# Patient Record
Sex: Female | Born: 1970 | Hispanic: No | Marital: Single | Smoking: Never smoker
Health system: Southern US, Community
[De-identification: ages and names within clinical notes are randomized; demographics above are authoritative.]

## PROBLEM LIST (undated history)

## (undated) DIAGNOSIS — J302 Other seasonal allergic rhinitis: Secondary | ICD-10-CM

## (undated) DIAGNOSIS — J45909 Unspecified asthma, uncomplicated: Secondary | ICD-10-CM

## (undated) DIAGNOSIS — G629 Polyneuropathy, unspecified: Secondary | ICD-10-CM

## (undated) DIAGNOSIS — K219 Gastro-esophageal reflux disease without esophagitis: Secondary | ICD-10-CM

## (undated) DIAGNOSIS — G43909 Migraine, unspecified, not intractable, without status migrainosus: Secondary | ICD-10-CM

## (undated) HISTORY — PX: ABDOMINAL HYSTERECTOMY: SHX81

## (undated) HISTORY — PX: PORTA CATH INSERTION: CATH118285

---

## 2016-11-03 ENCOUNTER — Emergency Department (HOSPITAL_COMMUNITY)
Admission: EM | Admit: 2016-11-03 | Discharge: 2016-11-03 | Disposition: A | Discharge status: Home / Self Care | Payer: BLUE CROSS/BLUE SHIELD | Attending: Emergency Medicine | Admitting: Emergency Medicine

## 2016-11-03 ENCOUNTER — Encounter (HOSPITAL_COMMUNITY): Payer: Self-pay | Admitting: Emergency Medicine

## 2016-11-03 DIAGNOSIS — E860 Dehydration: Secondary | ICD-10-CM | POA: Diagnosis not present

## 2016-11-03 DIAGNOSIS — R197 Diarrhea, unspecified: Secondary | ICD-10-CM | POA: Diagnosis present

## 2016-11-03 DIAGNOSIS — Z79899 Other long term (current) drug therapy: Secondary | ICD-10-CM | POA: Diagnosis not present

## 2016-11-03 LAB — LIPASE, BLOOD: Lipase: 30 U/L (ref 11–51)

## 2016-11-03 LAB — COMPREHENSIVE METABOLIC PANEL
ALBUMIN: 3.6 g/dL (ref 3.5–5.0)
ALT: 27 U/L (ref 14–54)
AST: 25 U/L (ref 15–41)
Alkaline Phosphatase: 49 U/L (ref 38–126)
Anion gap: 7 (ref 5–15)
BILIRUBIN TOTAL: 0.7 mg/dL (ref 0.3–1.2)
BUN: 17 mg/dL (ref 6–20)
CALCIUM: 8.8 mg/dL — AB (ref 8.9–10.3)
CO2: 25 mmol/L (ref 22–32)
Chloride: 104 mmol/L (ref 101–111)
Creatinine, Ser: 0.73 mg/dL (ref 0.44–1.00)
Glucose, Bld: 94 mg/dL (ref 65–99)
POTASSIUM: 3.9 mmol/L (ref 3.5–5.1)
Sodium: 136 mmol/L (ref 135–145)
TOTAL PROTEIN: 7.4 g/dL (ref 6.5–8.1)

## 2016-11-03 LAB — PREGNANCY, URINE: PREG TEST UR: NEGATIVE

## 2016-11-03 LAB — URINALYSIS, ROUTINE W REFLEX MICROSCOPIC
BACTERIA UA: NONE SEEN
BILIRUBIN URINE: NEGATIVE
GLUCOSE, UA: NEGATIVE mg/dL
Ketones, ur: NEGATIVE mg/dL
NITRITE: NEGATIVE
PROTEIN: NEGATIVE mg/dL
Specific Gravity, Urine: 1.024 (ref 1.005–1.030)
pH: 5 (ref 5.0–8.0)

## 2016-11-03 LAB — CBC
HEMATOCRIT: 37 % (ref 36.0–46.0)
HEMOGLOBIN: 12.6 g/dL (ref 12.0–15.0)
MCH: 32.6 pg (ref 26.0–34.0)
MCHC: 34.1 g/dL (ref 30.0–36.0)
MCV: 95.9 fL (ref 78.0–100.0)
Platelets: 192 10*3/uL (ref 150–400)
RBC: 3.86 MIL/uL — AB (ref 3.87–5.11)
RDW: 13.4 % (ref 11.5–15.5)
WBC: 5.3 10*3/uL (ref 4.0–10.5)

## 2016-11-03 LAB — POC URINE PREG, ED: Preg Test, Ur: NEGATIVE

## 2016-11-03 MED ORDER — SODIUM CHLORIDE 0.9 % IV BOLUS (SEPSIS)
1000.0000 mL | Freq: Once | INTRAVENOUS | Status: AC
  Administered 2016-11-03: 1000 mL via INTRAVENOUS

## 2016-11-03 MED ORDER — ONDANSETRON 4 MG PO TBDP: 4.0000 mg | ORAL_TABLET | Freq: Three times a day (TID) | ORAL | 0 refills | Status: DC | PRN

## 2016-11-03 NOTE — ED Notes (Signed)
Called lab to inquire about delay in labs.  States they have not been drawn.  Located them in the phlebotomist basket on cart.  Drawn at 1030.

## 2016-11-03 NOTE — Discharge Instructions (Signed)
Otc imodium as needed for diarrhea

## 2016-11-03 NOTE — ED Provider Notes (Addendum)
[facility] DEPT Provider Note   CSN: [id number] Arrival date & time: 11/03/16  9528     History   Chief Complaint Chief Complaint  Patient presents with  . Diarrhea    HPI Anna Stevenson is a 46 y.o. female.  Pt presents to the ED today with diarrhea and abdominal pain.  She denies fever, nausea and vomiting.      History reviewed. No pertinent past medical history.  There are no active problems to display for this patient.   Past Surgical History:  Procedure Laterality Date  . ABDOMINAL HYSTERECTOMY    . CESAREAN SECTION      OB History    No data available       Home Medications    Prior to Admission medications   Medication Sig Start Date End Date Taking? Authorizing Provider  Ascorbic Acid (VITAMIN C PO) Take 1 tablet by mouth daily.   Yes [provider]  Cholecalciferol (VITAMIN D PO) Take 1 tablet by mouth daily.   Yes [provider]  diclofenac (VOLTAREN) 50 MG EC tablet Take 50 mg by mouth 2 (two) times daily.   Yes [provider]  etodolac (LODINE) 300 MG capsule Take 300 mg by mouth every 8 (eight) hours as needed for moderate pain.   Yes [provider]  famotidine (PEPCID) 20 MG tablet Take 20 mg by mouth at bedtime.   Yes [provider]  gabapentin (NEURONTIN) 300 MG capsule Take 300 mg by mouth 3 (three) times daily.   Yes [provider]  rizatriptan (MAXALT) 10 MG tablet Take 10 mg by mouth as needed for migraine. May repeat in 2 hours if needed   Yes [provider]  tiZANidine (ZANAFLEX) 4 MG tablet Take 4 mg by mouth every 6 (six) hours as needed for muscle spasms.   Yes [provider]  topiramate (TOPAMAX) 50 MG tablet Take 50 mg by mouth 2 (two) times daily.   Yes [provider]  traMADol (ULTRAM) 50 MG tablet Take 50 mg by mouth 3 (three) times daily.   Yes [provider]  ondansetron (ZOFRAN ODT) 4 MG disintegrating tablet Take 1 tablet (4  mg total) by mouth every 8 (eight) hours as needed. 11/03/16   Isla Pence, MD    Family History No family history on file.  Social History Social History  Substance Use Topics  . Smoking status: Never Smoker  . Smokeless tobacco: Never Used  . Alcohol use Yes     Comment: occ     Allergies   Bactrim [sulfamethoxazole-trimethoprim]   Review of Systems Review of Systems  Gastrointestinal: Positive for abdominal pain and diarrhea.  All other systems reviewed and are negative.    Physical Exam Updated Vital Signs BP 118/74 (BP Location: Left Arm)   Pulse 84   Temp 98.7 F (37.1 C) (Oral)   Resp 16   Ht 5\' 9"  (1.753 m)   Wt 87.1 kg (192 lb)   SpO2 100%   BMI 28.35 kg/m   Physical Exam  Constitutional: She is oriented to person, place, and time. She appears well-developed and well-nourished.  HENT:  Head: Normocephalic and atraumatic.  Right Ear: External ear normal.  Left Ear: External ear normal.  Nose: Nose normal.  Mouth/Throat: Oropharynx is clear and moist.  Eyes: Pupils are equal, round, and reactive to light. Conjunctivae and EOM are normal.  Neck: Normal range of motion. Neck supple.  Cardiovascular: Normal rate, regular rhythm, normal heart  sounds and intact distal pulses.   Pulmonary/Chest: Effort normal and breath sounds normal.  Abdominal: Soft. Bowel sounds are normal.  Musculoskeletal: Normal range of motion.  Neurological: She is alert and oriented to person, place, and time.  Skin: Skin is warm.  Psychiatric: She has a normal mood and affect. Her behavior is normal. Judgment and thought content normal.  Nursing note and vitals reviewed.    ED Treatments / Results  Labs (all labs ordered are listed, but only abnormal results are displayed) Labs Reviewed  COMPREHENSIVE METABOLIC PANEL - Abnormal; Notable for the following:       Result Value   Calcium 8.8 (*)    All other components within normal limits  CBC - Abnormal; Notable for the  following:    RBC 3.86 (*)    All other components within normal limits  URINALYSIS, ROUTINE W REFLEX MICROSCOPIC - Abnormal; Notable for the following:    APPearance HAZY (*)    Hgb urine dipstick SMALL (*)    Leukocytes, UA TRACE (*)    Squamous Epithelial / LPF 6-30 (*)    All other components within normal limits  LIPASE, BLOOD  PREGNANCY, URINE    EKG  EKG Interpretation None       Radiology No results found.  Procedures Procedures (including critical care time)  Medications Ordered in ED Medications  sodium chloride 0.9 % bolus 1,000 mL (0 mLs Intravenous Stopped 11/03/16 1159)     Initial Impression / Assessment and Plan / ED Course  I have reviewed the triage vital signs and the nursing notes.  Pertinent labs & imaging results that were available during my care of the patient were reviewed by me and considered in my medical decision making (see chart for details).    Pt is feeling better.  She has not had any diarrhea here.  She has normal labs.  She knows to return if worse.  F/u with pcp.  Final Clinical Impressions(s) / ED Diagnoses   Final diagnoses:  Diarrhea, unspecified type  Dehydration    New Prescriptions New Prescriptions   ONDANSETRON (ZOFRAN ODT) 4 MG DISINTEGRATING TABLET    Take 1 tablet (4 mg total) by mouth every 8 (eight) hours as needed.     Isla Pence, MD 11/03/16 1252    Isla Pence, MD 11/12/16 1340

## 2016-11-03 NOTE — ED Triage Notes (Signed)
Patient complains of lower abdominal pain and diarrhea that started yesterday. Patient denies nausea, vomiting, fever.

## 2017-01-12 ENCOUNTER — Encounter (HOSPITAL_COMMUNITY): Payer: Self-pay | Admitting: *Deleted

## 2017-01-12 ENCOUNTER — Emergency Department (HOSPITAL_COMMUNITY)
Admission: EM | Admit: 2017-01-12 | Discharge: 2017-01-12 | Disposition: A | Discharge status: Home / Self Care | Payer: BLUE CROSS/BLUE SHIELD | Attending: Emergency Medicine | Admitting: Emergency Medicine

## 2017-01-12 DIAGNOSIS — J029 Acute pharyngitis, unspecified: Secondary | ICD-10-CM | POA: Insufficient documentation

## 2017-01-12 LAB — RAPID STREP SCREEN (MED CTR MEBANE ONLY): Streptococcus, Group A Screen (Direct): NEGATIVE

## 2017-01-12 MED ORDER — KETOROLAC TROMETHAMINE 60 MG/2ML IM SOLN
60.0000 mg | Freq: Once | INTRAMUSCULAR | Status: AC
  Administered 2017-01-12: 60 mg via INTRAMUSCULAR
  Filled 2017-01-12: qty 2

## 2017-01-12 MED ORDER — DEXAMETHASONE 4 MG PO TABS
10.0000 mg | ORAL_TABLET | Freq: Once | ORAL | Status: AC
  Administered 2017-01-12: 10 mg via ORAL
  Filled 2017-01-12: qty 3

## 2017-01-12 MED ORDER — IBUPROFEN 800 MG PO TABS: 800.0000 mg | ORAL_TABLET | Freq: Three times a day (TID) | ORAL | 0 refills | Status: DC

## 2017-01-12 NOTE — ED Provider Notes (Signed)
Emergency Department Provider Note   I have reviewed the triage vital signs and the nursing notes.   HISTORY  Chief Complaint Sore Throat   HPI Anna Stevenson is a 46 y.o. female who has had a sore throat for the last 3-4 weeks. Selection initially had been started on some steroids and inhaler for bronchitis and her symptoms improved over the last couple days her sore throat has come back again. She states is just sore just has some pain with swallowing and some mild hoarseness. No fevers. She does have some cough. No swollen lymph nodes in her neck. No other associated or modifying symptoms. No sick contacts. No recent travels. No fatigue.   History reviewed. No pertinent past medical history.  There are no active problems to display for this patient.   Past Surgical History:  Procedure Laterality Date  . ABDOMINAL HYSTERECTOMY    . CESAREAN SECTION      Current Outpatient Rx  . Order #: [id number] Class: Historical Med  . Order #: [id number] Class: Historical Med  . Order #: [id number] Class: Historical Med  . Order #: [id number] Class: Historical Med  . Order #: [id number] Class: Historical Med  . Order #: [id number] Class: Historical Med  . Order #: [id number] Class: Historical Med  . Order #: [id number] Class: Print  . Order #: [id number] Class: Print  . Order #: [id number] Class: Historical Med  . Order #: [id number] Class: Historical Med  . Order #: [id number] Class: Historical Med  . Order #: [id number] Class: Historical Med    Allergies Bactrim [sulfamethoxazole-trimethoprim]  History reviewed. No pertinent family history.  Social History Social History  Substance Use Topics  . Smoking status: Never Smoker  . Smokeless tobacco: Never Used  . Alcohol use Yes     Comment: occ    Review of Systems  All other systems negative except as documented in the HPI. All pertinent positives and negatives as reviewed in the  HPI. ____________________________________________   PHYSICAL EXAM:  VITAL SIGNS: ED Triage Vitals  Enc Vitals Group     BP 01/12/17 0643 134/79     Pulse Rate 01/12/17 0643 74     Resp 01/12/17 0643 20     Temp 01/12/17 0643 97.8 F (36.6 C)     Temp Source 01/12/17 0643 Oral     SpO2 01/12/17 0643 99 %     Weight 01/12/17 0639 192 lb (87.1 kg)     Height 01/12/17 0639 5\' 9"  (1.753 m)     Head Circumference --      Peak Flow --      Pain Score 01/12/17 0639 5     Pain Loc --      Pain Edu? --      Excl. in [place]? --     Constitutional: Alert and oriented. Well appearing and in no acute distress. Eyes: Conjunctivae are normal. PERRL. EOMI. Head: Atraumatic. Nose: No congestion/rhinnorhea. Mouth/Throat: Mucous membranes are moist.  Oropharynx with mild erythema. Neck: No stridor.  No meningeal signs.   Cardiovascular: Normal rate, regular rhythm. Good peripheral circulation. Grossly normal heart sounds.   Respiratory: Normal respiratory effort.  No retractions. Lungs CTAB. Gastrointestinal: Soft and nontender. No distention.  Musculoskeletal: No lower extremity tenderness nor edema. No gross deformities of extremities. Neurologic:  Normal speech and language. No gross focal neurologic deficits are appreciated.  Skin:  Skin is warm, dry and intact. No rash noted.   ____________________________________________   PROCEDURES  Procedure(s) performed:   Procedures   ____________________________________________   INITIAL IMPRESSION / ASSESSMENT  AND PLAN / ED COURSE  Pertinent labs & imaging results that were available during my care of the patient were reviewed by me and considered in my medical decision making (see chart for details).  Likely viral pharyngitis. No evidence for bacterial pharyngitis. No evidence of retropharyngeal abscess or peritonsillar abscess or Ludwig's angina. Plan for symptom treatment this point. Primary care follow-up. No indication for  antibiotic.   ____________________________________________  FINAL CLINICAL IMPRESSION(S) / ED DIAGNOSES  Final diagnoses:  Pharyngitis, unspecified etiology     MEDICATIONS GIVEN DURING THIS VISIT:  Medications  dexamethasone (DECADRON) tablet 10 mg (10 mg Oral Given 01/12/17 0736)  ketorolac (TORADOL) injection 60 mg (60 mg Intramuscular Given 01/12/17 0736)     NEW OUTPATIENT MEDICATIONS STARTED DURING THIS VISIT:  New Prescriptions   IBUPROFEN (ADVIL,MOTRIN) 800 MG TABLET    Take 1 tablet (800 mg total) by mouth 3 (three) times daily.    Note:  This document was prepared using Dragon voice recognition software and may include unintentional dictation errors.   Merrily Pew, MD 01/12/17 [phone]

## 2017-01-12 NOTE — ED Triage Notes (Signed)
Pt c/o sore throat for the last couple of days with a cough; pt states she has been coughing up pink tinged sputum

## 2017-01-14 LAB — CULTURE, GROUP A STREP (THRC)

## 2017-04-09 ENCOUNTER — Emergency Department (HOSPITAL_COMMUNITY): Payer: BLUE CROSS/BLUE SHIELD

## 2017-04-09 ENCOUNTER — Emergency Department (HOSPITAL_COMMUNITY)
Admission: EM | Admit: 2017-04-09 | Discharge: 2017-04-09 | Disposition: A | Discharge status: Home / Self Care | Payer: BLUE CROSS/BLUE SHIELD | Attending: Emergency Medicine | Admitting: Emergency Medicine

## 2017-04-09 ENCOUNTER — Encounter (HOSPITAL_COMMUNITY): Payer: Self-pay | Admitting: Emergency Medicine

## 2017-04-09 ENCOUNTER — Other Ambulatory Visit: Payer: Self-pay

## 2017-04-09 DIAGNOSIS — Z79899 Other long term (current) drug therapy: Secondary | ICD-10-CM | POA: Diagnosis not present

## 2017-04-09 DIAGNOSIS — M25511 Pain in right shoulder: Secondary | ICD-10-CM | POA: Diagnosis present

## 2017-04-09 DIAGNOSIS — J45909 Unspecified asthma, uncomplicated: Secondary | ICD-10-CM | POA: Insufficient documentation

## 2017-04-09 HISTORY — DX: Migraine, unspecified, not intractable, without status migrainosus: G43.909

## 2017-04-09 HISTORY — DX: Unspecified asthma, uncomplicated: J45.909

## 2017-04-09 HISTORY — DX: Other seasonal allergic rhinitis: J30.2

## 2017-04-09 HISTORY — DX: Polyneuropathy, unspecified: G62.9

## 2017-04-09 HISTORY — DX: Gastro-esophageal reflux disease without esophagitis: K21.9

## 2017-04-09 MED ORDER — DICLOFENAC SODIUM 75 MG PO TBEC: 75.0000 mg | DELAYED_RELEASE_TABLET | Freq: Two times a day (BID) | ORAL | 0 refills | Status: DC

## 2017-04-09 NOTE — ED Notes (Signed)
Pt transported to xray 

## 2017-04-09 NOTE — ED Triage Notes (Addendum)
Patient c/o right shoulder/arm pain. No known injury per patient. Patient states sher has had pain in shoulder that has been progressively getting worse since receiving the flu vaccination in October. Pain worse with movement.

## 2017-04-09 NOTE — ED Provider Notes (Signed)
[facility] EMERGENCY DEPARTMENT Provider Note   CSN: [id number] Arrival date & time: 04/09/17  1019     History   Chief Complaint Chief Complaint  Patient presents with  . Shoulder Pain    HPI Anna Stevenson is a 47 y.o. female.  HPI  Anna Stevenson is a 47 y.o. female who presents to the Emergency Department complaining of right shoulder pain for several months.  She describes an aching pain of the right shoulder that is associated with movement.  She states that she received a flu vaccination in October and noticed pain sometime shortly after that.  She denies known injury, neck pain, rash, chest pain, numbness or weakness of the lower extremities.  She has tried massage and over-the-counter pain relievers with minimal relief.  Past Medical History:  Diagnosis Date  . Asthma   . GERD (gastroesophageal reflux disease)   . Migraines   . Neuropathy   . Seasonal allergies     There are no active problems to display for this patient.   Past Surgical History:  Procedure Laterality Date  . ABDOMINAL HYSTERECTOMY    . CESAREAN SECTION      OB History    Gravida Para Term Preterm AB Living   3 3 2 1   3    SAB TAB Ectopic Multiple Live Births                   Home Medications    Prior to Admission medications   Medication Sig Start Date End Date Taking? Authorizing Provider  Ascorbic Acid (VITAMIN C PO) Take 1 tablet by mouth daily.    [provider]  Cholecalciferol (VITAMIN D PO) Take 1 tablet by mouth daily.    [provider]  diclofenac (VOLTAREN) 75 MG EC tablet Take 1 tablet (75 mg total) by mouth 2 (two) times daily. Take with food 04/09/17   Broadus Costilla, PA-C  famotidine (PEPCID) 20 MG tablet Take 20 mg by mouth at bedtime.    [provider]  Fluticasone Furoate-Vilanterol (BREO ELLIPTA IN) Inhale into the lungs.    [provider]  gabapentin (NEURONTIN) 300 MG capsule Take 300 mg by mouth 3 (three) times  daily.    [provider]  ondansetron (ZOFRAN ODT) 4 MG disintegrating tablet Take 1 tablet (4 mg total) by mouth every 8 (eight) hours as needed. 11/03/16   Isla Pence, MD  rizatriptan (MAXALT) 10 MG tablet Take 10 mg by mouth as needed for migraine. May repeat in 2 hours if needed    [provider]  tiZANidine (ZANAFLEX) 4 MG tablet Take 4 mg by mouth every 6 (six) hours as needed for muscle spasms.    [provider]  topiramate (TOPAMAX) 50 MG tablet Take 50 mg by mouth 2 (two) times daily.    [provider]  traMADol (ULTRAM) 50 MG tablet Take 50 mg by mouth 3 (three) times daily.    [provider]    Family History Family History  Problem Relation Age of Onset  . Diabetes Mother   . Stroke Mother   . Cancer Father   . Stroke Father     Social History Social History   Tobacco Use  . Smoking status: Never Smoker  . Smokeless tobacco: Never Used  Substance Use Topics  . Alcohol use: Yes    Comment: occ  . Drug use: No     Allergies   Bactrim [sulfamethoxazole-trimethoprim]   Review of Systems  Review of Systems  Constitutional: Negative for chills and fever.  Respiratory: Negative for chest tightness.   Cardiovascular: Negative for chest pain.  Musculoskeletal: Positive for arthralgias (Right shoulder pain). Negative for joint swelling and neck pain.  Skin: Negative for color change and wound.  Neurological: Negative for dizziness, weakness, numbness and headaches.  All other systems reviewed and are negative.    Physical Exam Updated Vital Signs BP 135/65 (BP Location: Left Arm)   Pulse (!) 56   Temp (!) 97.3 F (36.3 C) (Oral)   Resp 15   Ht 5\' 9"  (1.753 m)   Wt 87.1 kg (192 lb)   SpO2 100%   BMI 28.35 kg/m   Physical Exam  Constitutional: She is oriented to person, place, and time. She appears well-developed and well-nourished. No distress.  HENT:  Head: Normocephalic and atraumatic.  Neck: Normal  range of motion. Neck supple. No thyromegaly present.  Cardiovascular: Normal rate, regular rhythm and intact distal pulses.  No murmur heard. Radial pulses strong and palpable bilaterally  Pulmonary/Chest: Effort normal and breath sounds normal. No respiratory distress. She exhibits no tenderness.  Musculoskeletal: She exhibits tenderness. She exhibits no edema.  ttp of the anterior right shoulder.  Pain with abduction of the right arm and rotation of the shoulder.   Grip strength is 5/5 and symmetrical.   No edema , erythema or step-off deformity of the joint.   Lymphadenopathy:    She has no cervical adenopathy.  Neurological: She is alert and oriented to person, place, and time. She has normal strength. No sensory deficit. She exhibits normal muscle tone. Coordination normal.  Skin: Skin is warm. Capillary refill takes less than 2 seconds. No rash noted.  Nursing note and vitals reviewed.    ED Treatments / Results  Labs (all labs ordered are listed, but only abnormal results are displayed) Labs Reviewed - No data to display  EKG  EKG Interpretation None       Radiology Dg Shoulder Right  Result Date: 04/09/2017 CLINICAL DATA:  Right shoulder pain.  No reported injury. EXAM: RIGHT SHOULDER - 2+ VIEW COMPARISON:  None. FINDINGS: No fracture. No right glenohumeral joint dislocation. No right acromioclavicular joint separation. No suspicious focal osseous lesion. Tiny osteophytes at the posterior right glenohumeral joint without significant joint space narrowing. No significant right acromioclavicular joint arthropathy. No pathologic soft tissue calcifications. 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 No fracture or malalignment. Electronically Signed   By: Ilona Sorrel M.D.   On: 04/09/2017 11:33    Procedures Procedures (including critical care time)  Medications Ordered in ED Medications - No data to display   Initial Impression /  Assessment and Plan / ED Course  I have reviewed the triage vital signs and the nursing notes.  Pertinent labs & imaging results that were available during my care of the patient were reviewed by me and considered in my medical decision making (see chart for details).     Patient with right shoulder pain felt to be secondary to osteoarthritis.  Neurovascularly intact.  No concerning symptoms for septic joint.  She appears safe for discharge home, agrees to treatment with ice, heat, and nonsteroidal.  Referral information given for local orthopedics.  Final Clinical Impressions(s) / ED Diagnoses   Final diagnoses:  Acute pain of right shoulder    ED Discharge Orders        Ordered    diclofenac (VOLTAREN) 75 MG EC tablet  2 times daily  04/09/17 [place], Kyros Salzwedel, PA-C 04/09/17 2035    Virgel Manifold, MD 04/10/17 [phone]

## 2017-04-09 NOTE — Discharge Instructions (Signed)
Alternate ice heat to your shoulder.  Call Dr. Ruthe Mannan office to arrange a follow-up appt in one week if not improving

## 2017-11-25 ENCOUNTER — Emergency Department (HOSPITAL_COMMUNITY)
Admission: EM | Admit: 2017-11-25 | Discharge: 2017-11-25 | Disposition: A | Discharge status: Home / Self Care | Payer: BLUE CROSS/BLUE SHIELD | Attending: Emergency Medicine | Admitting: Emergency Medicine

## 2017-11-25 ENCOUNTER — Encounter (HOSPITAL_COMMUNITY): Payer: Self-pay

## 2017-11-25 ENCOUNTER — Other Ambulatory Visit: Payer: Self-pay

## 2017-11-25 ENCOUNTER — Emergency Department (HOSPITAL_COMMUNITY): Payer: BLUE CROSS/BLUE SHIELD

## 2017-11-25 DIAGNOSIS — R109 Unspecified abdominal pain: Secondary | ICD-10-CM

## 2017-11-25 DIAGNOSIS — J45909 Unspecified asthma, uncomplicated: Secondary | ICD-10-CM | POA: Insufficient documentation

## 2017-11-25 DIAGNOSIS — R197 Diarrhea, unspecified: Secondary | ICD-10-CM | POA: Diagnosis not present

## 2017-11-25 DIAGNOSIS — R1084 Generalized abdominal pain: Secondary | ICD-10-CM | POA: Diagnosis present

## 2017-11-25 DIAGNOSIS — Z79899 Other long term (current) drug therapy: Secondary | ICD-10-CM | POA: Insufficient documentation

## 2017-11-25 LAB — DIFFERENTIAL
BASOS ABS: 0 10*3/uL (ref 0.0–0.1)
BASOS PCT: 0 %
EOS PCT: 1 %
Eosinophils Absolute: 0.1 10*3/uL (ref 0.0–0.7)
LYMPHS ABS: 2.9 10*3/uL (ref 0.7–4.0)
Lymphocytes Relative: 40 %
Monocytes Absolute: 0.5 10*3/uL (ref 0.1–1.0)
Monocytes Relative: 7 %
Neutro Abs: 3.9 10*3/uL (ref 1.7–7.7)
Neutrophils Relative %: 52 %

## 2017-11-25 LAB — COMPREHENSIVE METABOLIC PANEL
ALBUMIN: 3.8 g/dL (ref 3.5–5.0)
ALK PHOS: 48 U/L (ref 38–126)
ALT: 39 U/L (ref 0–44)
ANION GAP: 7 (ref 5–15)
AST: 24 U/L (ref 15–41)
BILIRUBIN TOTAL: 0.5 mg/dL (ref 0.3–1.2)
BUN: 17 mg/dL (ref 6–20)
CALCIUM: 9.3 mg/dL (ref 8.9–10.3)
CO2: 28 mmol/L (ref 22–32)
Chloride: 103 mmol/L (ref 98–111)
Creatinine, Ser: 0.87 mg/dL (ref 0.44–1.00)
GFR calc non Af Amer: >60 mL/min (ref >60)
GLUCOSE: 110 mg/dL — AB (ref 70–99)
POTASSIUM: 3.5 mmol/L (ref 3.5–5.1)
SODIUM: 138 mmol/L (ref 135–145)
TOTAL PROTEIN: 7.9 g/dL (ref 6.5–8.1)

## 2017-11-25 LAB — CBC
HEMATOCRIT: 41.4 % (ref 36.0–46.0)
HEMOGLOBIN: 13.8 g/dL (ref 12.0–15.0)
MCH: 32.2 pg (ref 26.0–34.0)
MCHC: 33.3 g/dL (ref 30.0–36.0)
MCV: 96.5 fL (ref 78.0–100.0)
Platelets: 219 10*3/uL (ref 150–400)
RBC: 4.29 MIL/uL (ref 3.87–5.11)
RDW: 13.7 % (ref 11.5–15.5)
WBC: 7.7 10*3/uL (ref 4.0–10.5)

## 2017-11-25 LAB — URINALYSIS, ROUTINE W REFLEX MICROSCOPIC
BACTERIA UA: NONE SEEN
BILIRUBIN URINE: NEGATIVE
GLUCOSE, UA: NEGATIVE mg/dL
KETONES UR: NEGATIVE mg/dL
LEUKOCYTES UA: NEGATIVE
NITRITE: NEGATIVE
PROTEIN: NEGATIVE mg/dL
Specific Gravity, Urine: 1.021 (ref 1.005–1.030)
pH: 5 (ref 5.0–8.0)

## 2017-11-25 LAB — LIPASE, BLOOD: Lipase: 38 U/L (ref 11–51)

## 2017-11-25 MED ORDER — ONDANSETRON 4 MG PO TBDP: 4.0000 mg | ORAL_TABLET | Freq: Three times a day (TID) | ORAL | 0 refills | Status: DC | PRN

## 2017-11-25 MED ORDER — FAMOTIDINE IN NACL 20-0.9 MG/50ML-% IV SOLN
20.0000 mg | Freq: Once | INTRAVENOUS | Status: AC
  Administered 2017-11-25: 20 mg via INTRAVENOUS
  Filled 2017-11-25: qty 50

## 2017-11-25 MED ORDER — SODIUM CHLORIDE 0.9 % IV BOLUS
1000.0000 mL | Freq: Once | INTRAVENOUS | Status: AC
  Administered 2017-11-25: 1000 mL via INTRAVENOUS

## 2017-11-25 MED ORDER — IOPAMIDOL (ISOVUE-300) INJECTION 61%
100.0000 mL | Freq: Once | INTRAVENOUS | Status: AC | PRN
  Administered 2017-11-25: 100 mL via INTRAVENOUS

## 2017-11-25 MED ORDER — DICYCLOMINE HCL 20 MG PO TABS: 20.0000 mg | ORAL_TABLET | Freq: Four times a day (QID) | ORAL | 0 refills | Status: DC | PRN

## 2017-11-25 MED ORDER — ONDANSETRON HCL 4 MG/2ML IJ SOLN: 4.0000 mg | INTRAMUSCULAR | Status: DC | PRN

## 2017-11-25 MED ORDER — DICYCLOMINE HCL 10 MG/ML IM SOLN
20.0000 mg | Freq: Once | INTRAMUSCULAR | Status: AC
  Administered 2017-11-25: 20 mg via INTRAMUSCULAR
  Filled 2017-11-25: qty 2

## 2017-11-25 NOTE — ED Triage Notes (Addendum)
Pt reports abdominal pain pain started Sunday and diarrhea started yesterday. No vomiting. Stools loose and brown. Pt reports multiple loose stools today. Pt also reports increase in vaginal discharge

## 2017-11-25 NOTE — Discharge Instructions (Addendum)
Take the prescriptions as directed.  Increase your fluid intake (ie:  Gatoraide) for the next few days, as discussed.  Eat a bland diet and advance to your regular diet slowly as you can tolerate it.   Avoid full strength juices, as well as milk and milk products until your diarrhea has resolved.  Call your regular medical doctor tomorrow to schedule a follow up appointment this week.  Return to the Emergency Department immediately if not improving (or even worsening) despite taking the medicines as prescribed, any black or bloody stool or vomit, if you develop a fever over "101," or for any other concerns.

## 2017-11-25 NOTE — ED Provider Notes (Signed)
[facility] EMERGENCY DEPARTMENT Provider Note   CSN: [id number] Arrival date & time: 11/25/17  1757     History   Chief Complaint Chief Complaint  Patient presents with  . Abdominal Pain    HPI Anna Stevenson is a 47 y.o. female.  HPI  Pt was seen at [place].  Per pt, c/o gradual onset and persistence of constant generalized abd "pain" for the past 3 days.  Has been associated with multiple intermittent episodes of "brown" diarrhea.  Describes the abd pain as "cramping" and located more in her RLQ.  Denies nausea/vomiting, no fevers, no back pain, no rash, no CP/SOB, no black or blood in stools, no dysuria/hematuria. Denies recent abx use or travel.       Past Medical History:  Diagnosis Date  . Asthma   . GERD (gastroesophageal reflux disease)   . Migraines   . Neuropathy   . Seasonal allergies     There are no active problems to display for this patient.   Past Surgical History:  Procedure Laterality Date  . ABDOMINAL HYSTERECTOMY    . CESAREAN SECTION       OB History    Gravida  3   Para  3   Term  2   Preterm  1   AB      Living  3     SAB      TAB      Ectopic      Multiple      Live Births               Home Medications    Prior to Admission medications   Medication Sig Start Date End Date Taking? Authorizing Provider  Ascorbic Acid (VITAMIN C PO) Take 1 tablet by mouth daily.    [provider]  Cholecalciferol (VITAMIN D PO) Take 1 tablet by mouth daily.    [provider]  diclofenac (VOLTAREN) 75 MG EC tablet Take 1 tablet (75 mg total) by mouth 2 (two) times daily. Take with food 04/09/17   Triplett, Tammy, PA-C  famotidine (PEPCID) 20 MG tablet Take 20 mg by mouth at bedtime.    [provider]  Fluticasone Furoate-Vilanterol (BREO ELLIPTA IN) Inhale into the lungs.    [provider]  gabapentin (NEURONTIN) 300 MG capsule Take 300 mg by mouth 3 (three) times daily.    [provider]  ondansetron (ZOFRAN ODT) 4 MG disintegrating tablet Take 1 tablet (4 mg total) by mouth every 8 (eight) hours as needed. 11/03/16   Isla Pence, MD  rizatriptan (MAXALT) 10 MG tablet Take 10 mg by mouth as needed for migraine. May repeat in 2 hours if needed    [provider]  tiZANidine (ZANAFLEX) 4 MG tablet Take 4 mg by mouth every 6 (six) hours as needed for muscle spasms.    [provider]  topiramate (TOPAMAX) 50 MG tablet Take 50 mg by mouth 2 (two) times daily.    [provider]  traMADol (ULTRAM) 50 MG tablet Take 50 mg by mouth 3 (three) times daily.    [provider]    Family History Family History  Problem Relation Age of Onset  . Diabetes Mother   . Stroke Mother   . Cancer Father   . Stroke Father     Social History Social History   Tobacco Use  . Smoking status: Never Smoker  . Smokeless tobacco: Never Used  Substance Use Topics  .  Alcohol use: Yes    Comment: occ  . Drug use: No     Allergies   Bactrim [sulfamethoxazole-trimethoprim]   Review of Systems Review of Systems ROS: Statement: All systems negative except as marked or noted in the HPI; Constitutional: Negative for fever and chills. ; ; Eyes: Negative for eye pain, redness and discharge. ; ; ENMT: Negative for ear pain, hoarseness, nasal congestion, sinus pressure and sore throat. ; ; Cardiovascular: Negative for chest pain, palpitations, diaphoresis, dyspnea and peripheral edema. ; ; Respiratory: Negative for cough, wheezing and stridor. ; ; Gastrointestinal: +diarrhea, abd pain. Negative for nausea, vomiting, blood in stool, hematemesis, jaundice and rectal bleeding. . ; ; Genitourinary: Negative for dysuria, flank pain and hematuria. ; ; GYN:  No pelvic pain, no vaginal bleeding, no change in chronic vaginal discharge, no vulvar pain. ;; Musculoskeletal: Negative for back pain and neck pain. Negative for swelling and trauma.; ; Skin: Negative for pruritus,  rash, abrasions, blisters, bruising and skin lesion.; ; Neuro: Negative for headache, lightheadedness and neck stiffness. Negative for weakness, altered level of consciousness, altered mental status, extremity weakness, paresthesias, involuntary movement, seizure and syncope.       Physical Exam Updated Vital Signs BP 108/84   Pulse 69   Resp 17   Ht 5\' 9"  (1.753 m)   Wt 93.4 kg   SpO2 100%   BMI 30.42 kg/m   Physical Exam 1925: Physical examination:  Nursing notes reviewed; Vital signs and O2 SAT reviewed;  Constitutional: Well developed, Well nourished, Well hydrated, In no acute distress; Head:  Normocephalic, atraumatic; Eyes: EOMI, PERRL, No scleral icterus; ENMT: Mouth and pharynx normal, Mucous membranes moist; Neck: Supple, Full range of motion, No lymphadenopathy; Cardiovascular: Regular rate and rhythm, No gallop; Respiratory: Breath sounds clear & equal bilaterally, No wheezes.  Speaking full sentences with ease, Normal respiratory effort/excursion; Chest: Nontender, Movement normal; Abdomen: Soft, +RLQ>diffuse tenderness to palp.  No rebound or guarding. Nondistended, Normal bowel sounds; Genitourinary: No CVA tenderness; Extremities: Peripheral pulses normal, No tenderness, No edema, No calf edema or asymmetry.; Neuro: AA&Ox3, Major CN grossly intact.  Speech clear. No gross focal motor or sensory deficits in extremities.; Skin: Color normal, Warm, Dry.    ED Treatments / Results  Labs (all labs ordered are listed, but only abnormal results are displayed)   EKG None  Radiology   Procedures Procedures (including critical care time)  Medications Ordered in ED Medications  sodium chloride 0.9 % bolus 1,000 mL (1,000 mLs Intravenous New Bag/Given 11/25/17 1936)  famotidine (PEPCID) IVPB 20 mg premix (20 mg Intravenous New Bag/Given 11/25/17 1942)  ondansetron (ZOFRAN) injection 4 mg (has no administration in time range)  dicyclomine (BENTYL) injection 20 mg (20 mg  Intramuscular Given 11/25/17 1941)     Initial Impression / Assessment and Plan / ED Course  I have reviewed the triage vital signs and the nursing notes.  Pertinent labs & imaging results that were available during my care of the patient were reviewed by me and considered in my medical decision making (see chart for details).  MDM Reviewed: previous chart, nursing note and vitals Reviewed previous: labs Interpretation: labs and CT scan   Results for orders placed or performed during the hospital encounter of 11/25/17  Lipase, blood  Result Value Ref Range   Lipase 38 11 - 51 U/L  Comprehensive metabolic panel  Result Value Ref Range   Sodium 138 135 - 145 mmol/L   Potassium 3.5 3.5 - 5.1 mmol/L  Chloride 103 98 - 111 mmol/L   CO2 28 22 - 32 mmol/L   Glucose, Bld 110 (H) 70 - 99 mg/dL   BUN 17 6 - 20 mg/dL   Creatinine, Ser 0.87 0.44 - 1.00 mg/dL   Calcium 9.3 8.9 - 10.3 mg/dL   Total Protein 7.9 6.5 - 8.1 g/dL   Albumin 3.8 3.5 - 5.0 g/dL   AST 24 15 - 41 U/L   ALT 39 0 - 44 U/L   Alkaline Phosphatase 48 38 - 126 U/L   Total Bilirubin 0.5 0.3 - 1.2 mg/dL   GFR calc non Af Amer >60 >60 mL/min   GFR calc Af Amer >60 >60 mL/min   Anion gap 7 5 - 15  CBC  Result Value Ref Range   WBC 7.7 4.0 - 10.5 K/uL   RBC 4.29 3.87 - 5.11 MIL/uL   Hemoglobin 13.8 12.0 - 15.0 g/dL   HCT 41.4 36.0 - 46.0 %   MCV 96.5 78.0 - 100.0 fL   MCH 32.2 26.0 - 34.0 pg   MCHC 33.3 30.0 - 36.0 g/dL   RDW 13.7 11.5 - 15.5 %   Platelets 219 150 - 400 K/uL  Urinalysis, Routine w reflex microscopic  Result Value Ref Range   Color, Urine YELLOW YELLOW   APPearance HAZY (A) CLEAR   Specific Gravity, Urine 1.021 1.005 - 1.030   pH 5.0 5.0 - 8.0   Glucose, UA NEGATIVE NEGATIVE mg/dL   Hgb urine dipstick SMALL (A) NEGATIVE   Bilirubin Urine NEGATIVE NEGATIVE   Ketones, ur NEGATIVE NEGATIVE mg/dL   Protein, ur NEGATIVE NEGATIVE mg/dL   Nitrite NEGATIVE NEGATIVE   Leukocytes, UA NEGATIVE NEGATIVE    RBC / HPF 0-5 0 - 5 RBC/hpf   WBC, UA 0-5 0 - 5 WBC/hpf   Bacteria, UA NONE SEEN NONE SEEN   Squamous Epithelial / LPF 0-5 0 - 5   Mucus PRESENT   Differential  Result Value Ref Range   Neutrophils Relative % 52 %   Neutro Abs 3.9 1.7 - 7.7 K/uL   Lymphocytes Relative 40 %   Lymphs Abs 2.9 0.7 - 4.0 K/uL   Monocytes Relative 7 %   Monocytes Absolute 0.5 0.1 - 1.0 K/uL   Eosinophils Relative 1 %   Eosinophils Absolute 0.1 0.0 - 0.7 K/uL   Basophils Relative 0 %   Basophils Absolute 0.0 0.0 - 0.1 K/uL    Ct Abdomen Pelvis W Contrast Result Date: 11/25/2017 CLINICAL DATA:  Abdominal pain, diarrhea EXAM: CT ABDOMEN AND PELVIS WITH CONTRAST TECHNIQUE: Multidetector CT imaging of the abdomen and pelvis was performed using the standard protocol following bolus administration of intravenous contrast. CONTRAST:  146mL ISOVUE-300 IOPAMIDOL (ISOVUE-300) INJECTION 61% COMPARISON:  None. FINDINGS: Lower chest: Lung bases are clear. No effusions. Heart is normal size. Hepatobiliary: No focal hepatic abnormality. Gallbladder unremarkable. Pancreas: No focal abnormality or ductal dilatation. Spleen: No focal abnormality.  Normal size. Adrenals/Urinary Tract: No adrenal abnormality. No focal renal abnormality. No stones or hydronephrosis. Urinary bladder is unremarkable. Stomach/Bowel: Normal appendix. Stomach, large and small bowel grossly unremarkable. Vascular/Lymphatic: No evidence of aneurysm or adenopathy. Reproductive: Prior hysterectomy.  No adnexal masses. Other: No free fluid or free air. Musculoskeletal: No acute bony abnormality. IMPRESSION: No acute findings in the abdomen or pelvis. Electronically Signed   By: Rolm Baptise M.D.   On: 11/25/2017 21:01    2145:  Pt has tol PO well while in the ED without N/V.  No stooling while in the ED.  Abd benign, VSS. Feels better and wants to go home now. Tx symptomatically at this time. Dx and testing d/w pt.  Questions answered.  Verb understanding,  agreeable to d/c home with outpt f/u.    Final Clinical Impressions(s) / ED Diagnoses   Final diagnoses:  None    ED Discharge Orders    None       Francine Graven, DO 11/27/17 2325

## 2017-11-25 NOTE — ED Notes (Signed)
Pt given ginger ale. Pt denies nausea at this time after drinking ginger ale.

## 2018-03-21 ENCOUNTER — Encounter (HOSPITAL_COMMUNITY): Payer: Self-pay | Admitting: Emergency Medicine

## 2018-03-21 ENCOUNTER — Emergency Department (HOSPITAL_COMMUNITY)
Admission: EM | Admit: 2018-03-21 | Discharge: 2018-03-21 | Disposition: A | Discharge status: Home / Self Care | Payer: BLUE CROSS/BLUE SHIELD | Attending: Emergency Medicine | Admitting: Emergency Medicine

## 2018-03-21 DIAGNOSIS — J45909 Unspecified asthma, uncomplicated: Secondary | ICD-10-CM | POA: Insufficient documentation

## 2018-03-21 DIAGNOSIS — R05 Cough: Secondary | ICD-10-CM | POA: Diagnosis present

## 2018-03-21 DIAGNOSIS — Z79899 Other long term (current) drug therapy: Secondary | ICD-10-CM | POA: Insufficient documentation

## 2018-03-21 DIAGNOSIS — J069 Acute upper respiratory infection, unspecified: Secondary | ICD-10-CM | POA: Diagnosis not present

## 2018-03-21 MED ORDER — HYDROCODONE-HOMATROPINE 5-1.5 MG/5ML PO SYRP: 5.0000 mL | ORAL_SOLUTION | Freq: Four times a day (QID) | ORAL | 0 refills | Status: DC | PRN

## 2018-03-21 MED ORDER — LORATADINE-PSEUDOEPHEDRINE ER 5-120 MG PO TB12: 1.0000 | ORAL_TABLET | Freq: Two times a day (BID) | ORAL | 0 refills | Status: DC

## 2018-03-21 NOTE — ED Triage Notes (Signed)
Pt reports coughing/sore throat and generalized body aches for one month.NAD noted.

## 2018-03-21 NOTE — ED Provider Notes (Signed)
[facility] EMERGENCY DEPARTMENT Provider Note   CSN: [id number] Arrival date & time: 03/21/18  1352     History   Chief Complaint Chief Complaint  Patient presents with  . Generalized Body Aches    HPI Anna Stevenson is a 47 y.o. female.  The history is provided by the patient.  URI   This is a new problem. The current episode started more than 1 week ago. The problem has been gradually worsening. There has been no fever. Associated symptoms include congestion, sore throat and cough. Pertinent negatives include no chest pain, no abdominal pain, no dysuria, no neck pain and no wheezing. Treatments tried: vit C,penicillin and robatussin.    Past Medical History:  Diagnosis Date  . Asthma   . GERD (gastroesophageal reflux disease)   . Migraines   . Neuropathy   . Seasonal allergies     There are no active problems to display for this patient.   Past Surgical History:  Procedure Laterality Date  . ABDOMINAL HYSTERECTOMY    . CESAREAN SECTION       OB History    Gravida  3   Para  3   Term  2   Preterm  1   AB      Living  3     SAB      TAB      Ectopic      Multiple      Live Births               Home Medications    Prior to Admission medications   Medication Sig Start Date End Date Taking? Authorizing Provider  Ascorbic Acid (VITAMIN C PO) Take 1 tablet by mouth daily.    [provider]  Cholecalciferol (VITAMIN D PO) Take 1 tablet by mouth daily.    [provider]  diclofenac (VOLTAREN) 75 MG EC tablet Take 1 tablet (75 mg total) by mouth 2 (two) times daily. Take with food 04/09/17   Triplett, Tammy, PA-C  dicyclomine (BENTYL) 20 MG tablet Take 1 tablet (20 mg total) by mouth every 6 (six) hours as needed for spasms (abdominal cramping). 11/25/17   Francine Graven, DO  famotidine (PEPCID) 20 MG tablet Take 20 mg by mouth at bedtime.    [provider]  Fluticasone Furoate-Vilanterol (BREO ELLIPTA IN)  Inhale into the lungs.    [provider]  gabapentin (NEURONTIN) 300 MG capsule Take 300 mg by mouth 3 (three) times daily.    [provider]  ondansetron (ZOFRAN ODT) 4 MG disintegrating tablet Take 1 tablet (4 mg total) by mouth every 8 (eight) hours as needed for nausea or vomiting. 11/25/17   Francine Graven, DO  rizatriptan (MAXALT) 10 MG tablet Take 10 mg by mouth as needed for migraine. May repeat in 2 hours if needed    [provider]  tiZANidine (ZANAFLEX) 4 MG tablet Take 4 mg by mouth every 6 (six) hours as needed for muscle spasms.    [provider]  topiramate (TOPAMAX) 50 MG tablet Take 50 mg by mouth 2 (two) times daily.    [provider]  traMADol (ULTRAM) 50 MG tablet Take 50 mg by mouth 3 (three) times daily.    [provider]    Family History Family History  Problem Relation Age of Onset  . Diabetes Mother   . Stroke Mother   . Cancer Father   . Stroke Father     Social  History Social History   Tobacco Use  . Smoking status: Never Smoker  . Smokeless tobacco: Never Used  Substance Use Topics  . Alcohol use: Yes    Comment: occ  . Drug use: No     Allergies   Bactrim [sulfamethoxazole-trimethoprim]   Review of Systems Review of Systems  Constitutional: Negative for activity change.       All ROS Neg except as noted in HPI  HENT: Positive for congestion and sore throat. Negative for nosebleeds.   Eyes: Negative for photophobia and discharge.  Respiratory: Positive for cough. Negative for shortness of breath and wheezing.   Cardiovascular: Negative for chest pain and palpitations.  Gastrointestinal: Negative for abdominal pain and blood in stool.  Genitourinary: Negative for dysuria, frequency and hematuria.  Musculoskeletal: Negative for arthralgias, back pain and neck pain.  Skin: Negative.   Neurological: Negative for dizziness, seizures and speech difficulty.  Psychiatric/Behavioral:  Negative for confusion and hallucinations.     Physical Exam Updated Vital Signs BP 124/74 (BP Location: Right Arm)   Pulse 60   Temp 98 F (36.7 C) (Oral)   Resp 14   Ht 5\' 9"  (1.753 m)   Wt 91.2 kg   SpO2 99%   BMI 29.68 kg/m   Physical Exam Vitals signs and nursing note reviewed.  Constitutional:      Appearance: She is well-developed. She is not toxic-appearing.  HENT:     Head: Normocephalic.     Right Ear: Tympanic membrane and external ear normal.     Left Ear: Tympanic membrane and external ear normal.  Eyes:     General: Lids are normal.     Pupils: Pupils are equal, round, and reactive to light.  Neck:     Musculoskeletal: Normal range of motion and neck supple.     Vascular: No carotid bruit.  Cardiovascular:     Rate and Rhythm: Normal rate and regular rhythm.     Pulses: Normal pulses.     Heart sounds: Normal heart sounds.  Pulmonary:     Effort: No respiratory distress.     Breath sounds: Normal breath sounds.  Abdominal:     General: Bowel sounds are normal.     Palpations: Abdomen is soft.     Tenderness: There is no abdominal tenderness. There is no guarding.  Musculoskeletal: Normal range of motion.  Lymphadenopathy:     Head:     Right side of head: No submandibular adenopathy.     Left side of head: No submandibular adenopathy.     Cervical: No cervical adenopathy.  Skin:    General: Skin is warm and dry.  Neurological:     Mental Status: She is alert and oriented to person, place, and time.     Cranial Nerves: No cranial nerve deficit.     Sensory: No sensory deficit.  Psychiatric:        Speech: Speech normal.      ED Treatments / Results  Labs (all labs ordered are listed, but only abnormal results are displayed) Labs Reviewed - No data to display  EKG None  Radiology No results found.  Procedures Procedures (including critical care time)  Medications Ordered in ED Medications - No data to display   Initial  Impression / Assessment and Plan / ED Course  I have reviewed the triage vital signs and the nursing notes.  Pertinent labs & imaging results that were available during my care of the patient were reviewed by me  and considered in my medical decision making (see chart for details).      Final Clinical Impressions(s) / ED Diagnoses  MDM Vital signs reviewed.  Pulse oximetry is 99% on room air.  Within normal limits by my interpretation.  The examination favors an upper respiratory infection.  The airway is patent.  There is no tachycardia appreciated.  The pulse oximetry is 99% on room air.  I have asked the patient to use Tylenol every 4 hours or ibuprofen every 6 hours for fever and body aches.  I have asked her to increase fluids, and to wash hands frequently.  The patient is to use Claritin-D for congestion.  She will see the primary physician or return to the emergency department if any changes in condition, problems, or concerns.        Final diagnoses:  Upper respiratory tract infection, unspecified type    ED Discharge Orders    None       Lily Kocher, PA-C 03/21/18 1713    Milton Ferguson, MD 03/21/18 [phone]

## 2018-03-21 NOTE — Discharge Instructions (Addendum)
Vital signs are within normal limits.  Oxygen level is 99% on room air.  The examination is consistent with an upper respiratory infection.  Please have everyone in the house wash hands frequently.  Please increase your fluids.  Use Tylenol every 4 hours or ibuprofen every 6 hours for fever or aching.  Please use Claritin-D every 12 hours or 2 times daily.  Use Hycodan every 6 hours as needed for cough and congestion.  This medication may cause drowsiness.  Please do not drive a vehicle, operate machinery, or participate in activities requiring concentration when taking this medication.

## 2018-11-08 IMAGING — CT CT ABD-PELV W/ CM
2 of 5 series · 16 of 46 positions shown, 18 images · IV contrast (Isovue)
Comparison: None.

CLINICAL DATA: Abdominal pain, diarrhea

EXAM:
CT ABDOMEN AND PELVIS WITH CONTRAST
TECHNIQUE: Multidetector CT imaging of the abdomen and pelvis was performed
using the standard protocol following bolus administration of
intravenous contrast.
CONTRAST:  100mL PQNIMR-XRR IOPAMIDOL (PQNIMR-XRR) INJECTION 61%

[Series 2: axial st · axial · 0.76mm/px · z∈[-594,-194]mm · 13 of 92 slices shown, 15 images]
[im 6/92  soft-tissue]
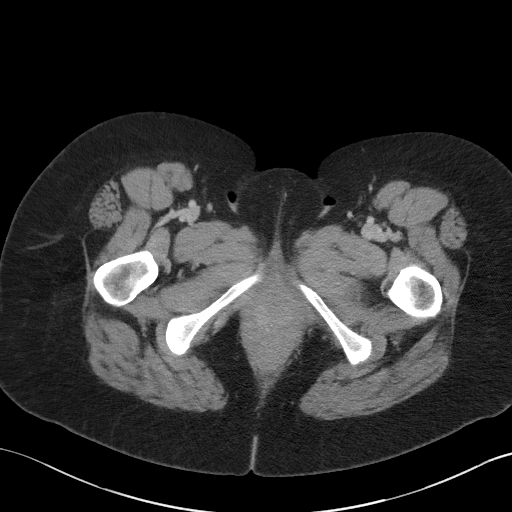
[im 6/92  bone]
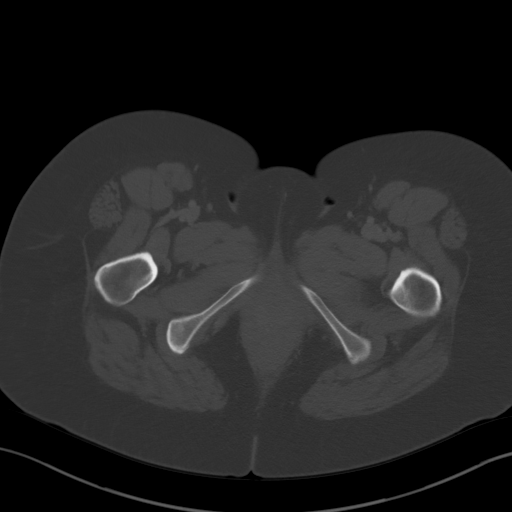
[im 11/92  soft-tissue]
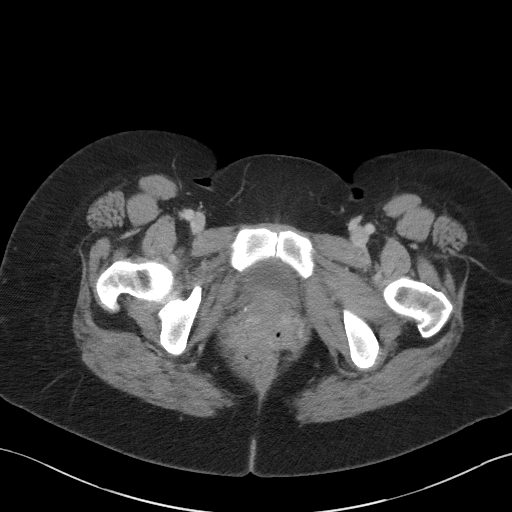
[im 21/92  soft-tissue]
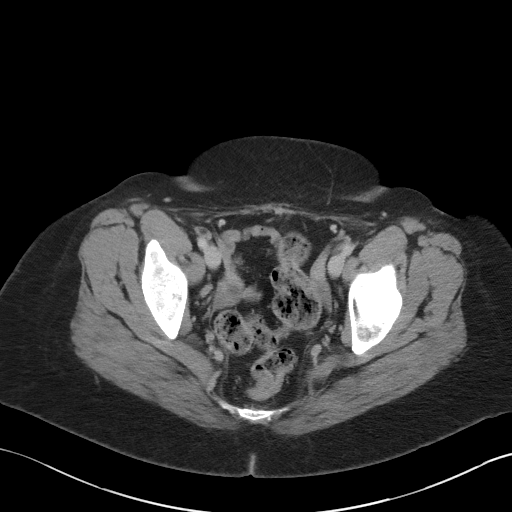
[im 26/92  soft-tissue]
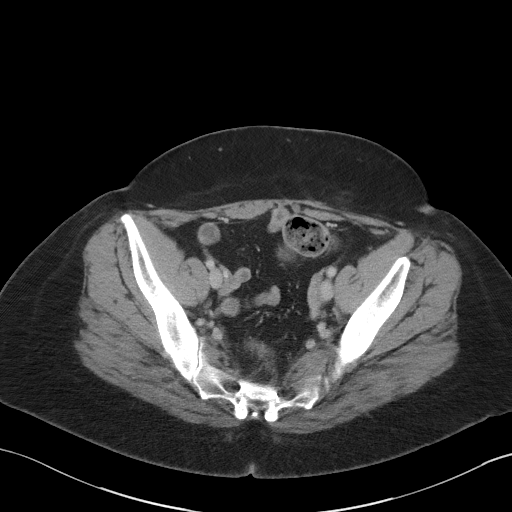
[im 31/92  soft-tissue]
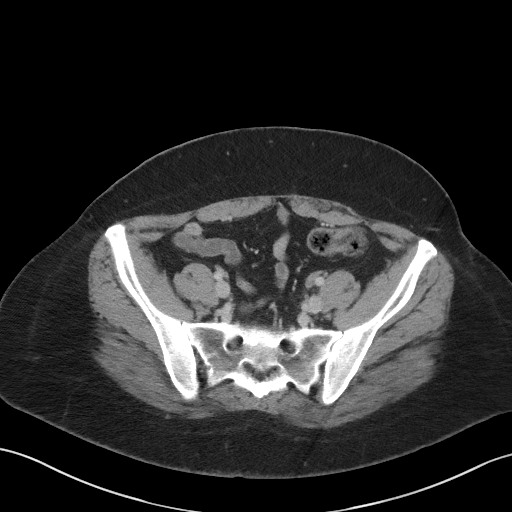
[im 41/92  soft-tissue]
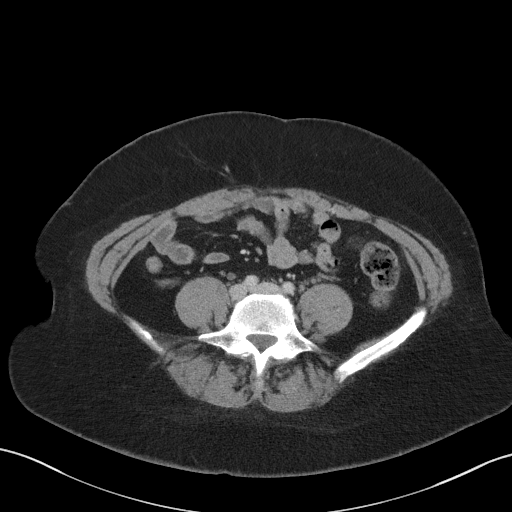
[im 46/92  soft-tissue]
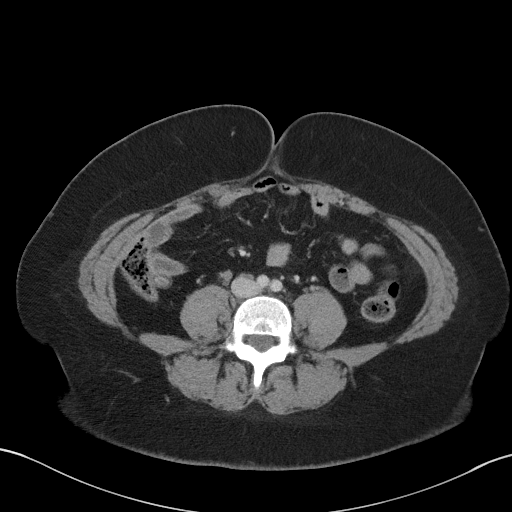
[im 51/92  soft-tissue]
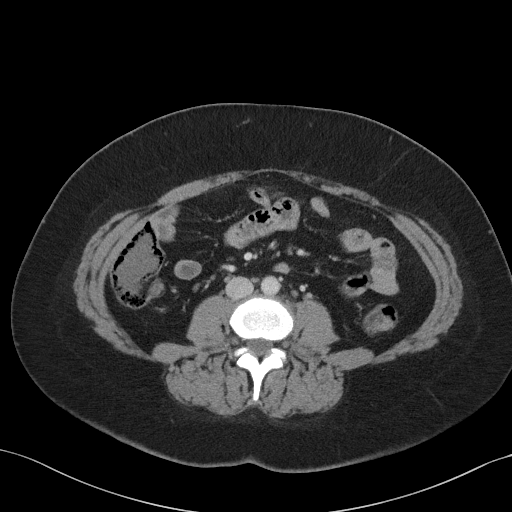
[im 61/92  soft-tissue]
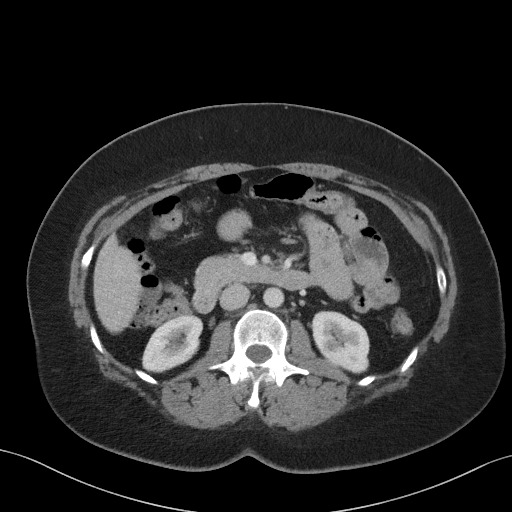
[im 61/92  bone]
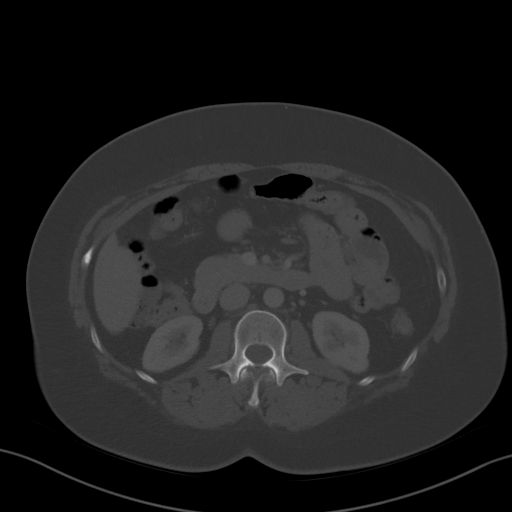
[im 66/92  soft-tissue]
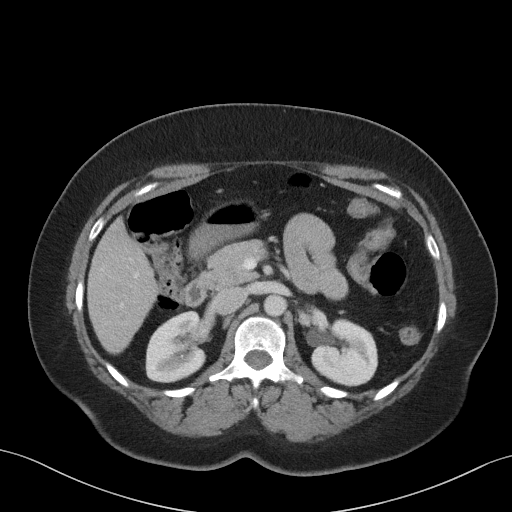
[im 71/92  soft-tissue]
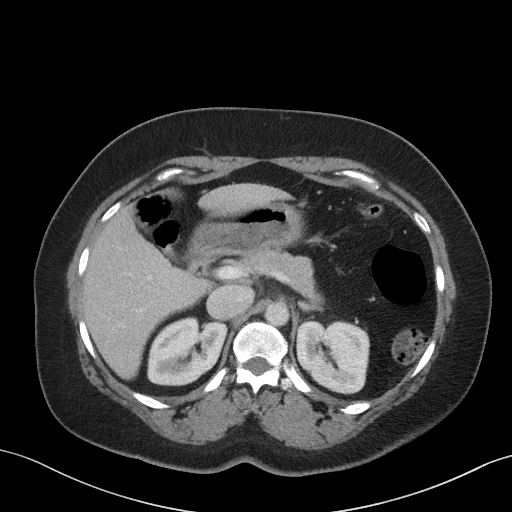
[im 81/92  soft-tissue]
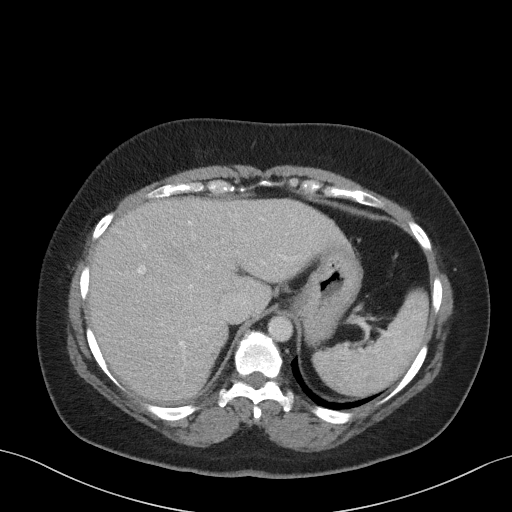
[im 86/92  soft-tissue]
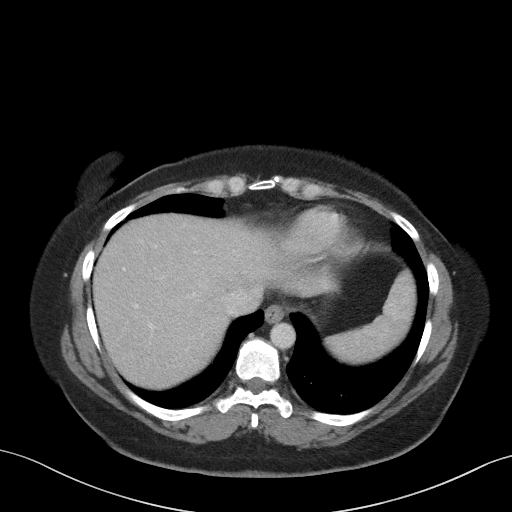

[Series 4: coronal st · coronal · 0.68mm/px · 3 of 92 slices shown]
[im 31/92  soft-tissue]
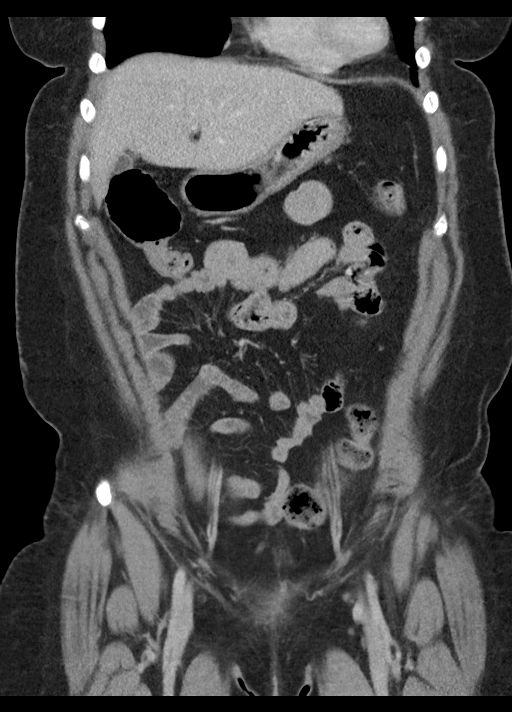
[im 41/92  soft-tissue]
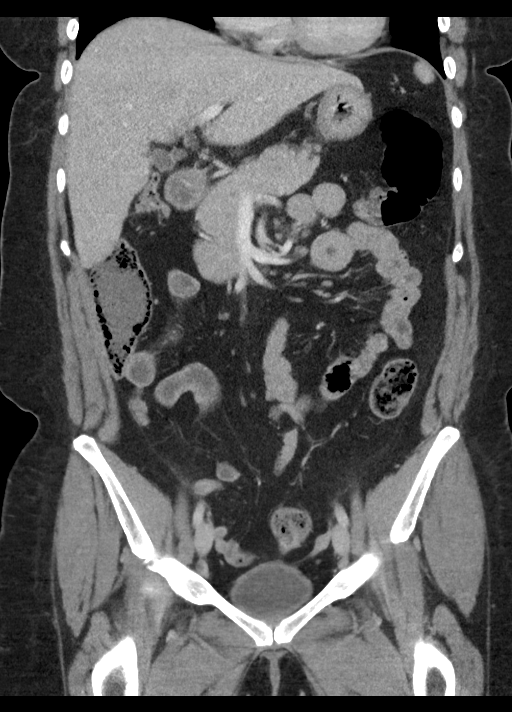
[im 51/92  soft-tissue]
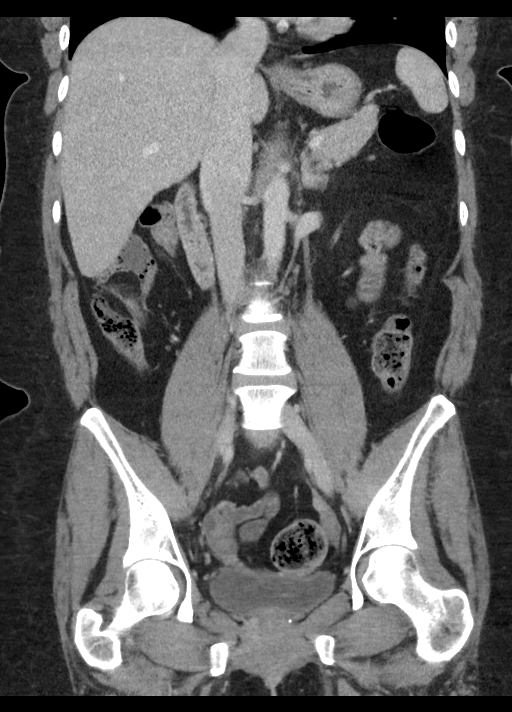

[16 of 46 positions shown; findings below may reference images not displayed]

FINDINGS: Lower chest: Lung bases are clear. No effusions. Heart is normal
size.

Hepatobiliary: No focal hepatic abnormality. Gallbladder
unremarkable.

Pancreas: No focal abnormality or ductal dilatation.

Spleen: No focal abnormality.  Normal size.

Adrenals/Urinary Tract: No adrenal abnormality. No focal renal
abnormality. No stones or hydronephrosis. Urinary bladder is
unremarkable.

Stomach/Bowel: Normal appendix. Stomach, large and small bowel
grossly unremarkable.

Vascular/Lymphatic: No evidence of aneurysm or adenopathy.

Reproductive: Prior hysterectomy.  No adnexal masses.

Other: No free fluid or free air.

Musculoskeletal: No acute bony abnormality.
IMPRESSION: No acute findings in the abdomen or pelvis.

## 2019-02-16 ENCOUNTER — Other Ambulatory Visit: Payer: Self-pay

## 2019-02-16 DIAGNOSIS — Z20822 Contact with and (suspected) exposure to covid-19: Secondary | ICD-10-CM

## 2019-02-18 LAB — NOVEL CORONAVIRUS, NAA: SARS-CoV-2, NAA: NOT DETECTED

## 2019-11-14 ENCOUNTER — Encounter: Payer: Self-pay | Admitting: Internal Medicine

## 2020-01-01 NOTE — Progress Notes (Deleted)
Referring Provider: Tylene Fantasia, NP Primary Care Physician:  Neale Burly, MD Primary Gastroenterologist:  Dr. Rayne Du chief complaint on file.   HPI:   Anna Stevenson is a 49 y.o. female presenting today at the request of Tylene Fantasia, NP (OB/GYN) for constipation.  Reviewed office visit with OB/GYN dated 11/02/2019.  Patient was presenting for follow-up of pelvic pain.  She has been treated for bacterial vaginosis.  Urine culture and STD screen were negative.  She continued with intermittent abdominal pain admitted to ongoing constipation reporting she could go weeks without having a bowel movement.  She had never been worked up for this.  Plan to refer to GI for further evaluation.  Today:    Past Medical History:  Diagnosis Date  . Asthma   . GERD (gastroesophageal reflux disease)   . Migraines   . Neuropathy   . Seasonal allergies     Past Surgical History:  Procedure Laterality Date  . ABDOMINAL HYSTERECTOMY    . CESAREAN SECTION      Current Outpatient Medications  Medication Sig Dispense Refill  . Ascorbic Acid (VITAMIN C PO) Take 1 tablet by mouth daily.    . Cholecalciferol (VITAMIN D PO) Take 1 tablet by mouth daily.    . diclofenac (VOLTAREN) 75 MG EC tablet Take 1 tablet (75 mg total) by mouth 2 (two) times daily. Take with food 14 tablet 0  . dicyclomine (BENTYL) 20 MG tablet Take 1 tablet (20 mg total) by mouth every 6 (six) hours as needed for spasms (abdominal cramping). 15 tablet 0  . famotidine (PEPCID) 20 MG tablet Take 20 mg by mouth at bedtime.    . Fluticasone Furoate-Vilanterol (BREO ELLIPTA IN) Inhale into the lungs.    . gabapentin (NEURONTIN) 300 MG capsule Take 300 mg by mouth 3 (three) times daily.    Marland Kitchen HYDROcodone-homatropine (HYCODAN) 5-1.5 MG/5ML syrup Take 5 mLs by mouth every 6 (six) hours as needed. 120 mL 0  . loratadine-pseudoephedrine (CLARITIN-D 12 HOUR) 5-120 MG tablet Take 1 tablet by mouth 2 (two) times daily. 20 tablet  0  . ondansetron (ZOFRAN ODT) 4 MG disintegrating tablet Take 1 tablet (4 mg total) by mouth every 8 (eight) hours as needed for nausea or vomiting. 6 tablet 0  . rizatriptan (MAXALT) 10 MG tablet Take 10 mg by mouth as needed for migraine. May repeat in 2 hours if needed    . tiZANidine (ZANAFLEX) 4 MG tablet Take 4 mg by mouth every 6 (six) hours as needed for muscle spasms.    Marland Kitchen topiramate (TOPAMAX) 50 MG tablet Take 50 mg by mouth 2 (two) times daily.    . traMADol (ULTRAM) 50 MG tablet Take 50 mg by mouth 3 (three) times daily.     No current facility-administered medications for this visit.    Allergies as of 01/02/2020 - Review Complete 03/21/2018  Allergen Reaction Noted  . Bactrim [sulfamethoxazole-trimethoprim] Hives 11/03/2016    Family History  Problem Relation Age of Onset  . Diabetes Mother   . Stroke Mother   . Cancer Father   . Stroke Father     Social History   Socioeconomic History  . Marital status: Single    Spouse name: Not on file  . Number of children: Not on file  . Years of education: Not on file  . Highest education level: Not on file  Occupational History  . Not on file  Tobacco Use  . Smoking status:  Never Smoker  . Smokeless tobacco: Never Used  Vaping Use  . Vaping Use: Never used  Substance and Sexual Activity  . Alcohol use: Yes    Comment: occ  . Drug use: No  . Sexual activity: Not on file  Other Topics Concern  . Not on file  Social History Narrative  . Not on file   Social Determinants of Health   Financial Resource Strain:   . Difficulty of Paying Living Expenses: Not on file  Food Insecurity:   . Worried About Charity fundraiser in the Last Year: Not on file  . Ran Out of Food in the Last Year: Not on file  Transportation Needs:   . Lack of Transportation (Medical): Not on file  . Lack of Transportation (Non-Medical): Not on file  Physical Activity:   . Days of Exercise per Week: Not on file  . Minutes of Exercise per  Session: Not on file  Stress:   . Feeling of Stress : Not on file  Social Connections:   . Frequency of Communication with Friends and Family: Not on file  . Frequency of Social Gatherings with Friends and Family: Not on file  . Attends Religious Services: Not on file  . Active Member of Clubs or Organizations: Not on file  . Attends Archivist Meetings: Not on file  . Marital Status: Not on file  Intimate Partner Violence:   . Fear of Current or Ex-Partner: Not on file  . Emotionally Abused: Not on file  . Physically Abused: Not on file  . Sexually Abused: Not on file    Review of Systems: Gen: Denies any fever, chills, fatigue, weight loss, lack of appetite.  CV: Denies chest pain, heart palpitations, peripheral edema, syncope.  Resp: Denies shortness of breath at rest or with exertion. Denies wheezing or cough.  GI: Denies dysphagia or odynophagia. Denies jaundice, hematemesis, fecal incontinence. GU : Denies urinary burning, urinary frequency, urinary hesitancy MS: Denies joint pain, muscle weakness, cramps, or limitation of movement.  Derm: Denies rash, itching, dry skin Psych: Denies depression, anxiety, memory loss, and confusion Heme: Denies bruising, bleeding, and enlarged lymph nodes.  Physical Exam: There were no vitals taken for this visit. General:   Alert and oriented. Pleasant and cooperative. Well-nourished and well-developed.  Head:  Normocephalic and atraumatic. Eyes:  Without icterus, sclera clear and conjunctiva pink.  Ears:  Normal auditory acuity. Nose:  No deformity, discharge,  or lesions. Mouth:  No deformity or lesions, oral mucosa pink.  Neck:  Supple, without mass or thyromegaly. Lungs:  Clear to auscultation bilaterally. No wheezes, rales, or rhonchi. No distress.  Heart:  S1, S2 present without murmurs appreciated.  Abdomen:  +BS, soft, non-tender and non-distended. No HSM noted. No guarding or rebound. No masses appreciated.  Rectal:   Deferred  Msk:  Symmetrical without gross deformities. Normal posture. Pulses:  Normal pulses noted. Extremities:  Without clubbing or edema. Neurologic:  Alert and  oriented x4;  grossly normal neurologically. Skin:  Intact without significant lesions or rashes. Cervical Nodes:  No significant cervical adenopathy. Psych:  Alert and cooperative. Normal mood and affect.

## 2020-01-02 ENCOUNTER — Ambulatory Visit: Payer: BLUE CROSS/BLUE SHIELD | Admitting: Gastroenterology

## 2020-01-02 ENCOUNTER — Encounter: Payer: Self-pay | Admitting: Gastroenterology

## 2020-01-30 DIAGNOSIS — C50919 Malignant neoplasm of unspecified site of unspecified female breast: Secondary | ICD-10-CM

## 2020-01-30 HISTORY — DX: Malignant neoplasm of unspecified site of unspecified female breast: C50.919

## 2020-02-13 ENCOUNTER — Encounter: Payer: Self-pay | Admitting: Gastroenterology

## 2020-03-31 NOTE — Progress Notes (Signed)
Referring Provider: Orbie Hurst, NP Primary Care Physician:  Toma Deiters, MD Primary Gastroenterologist:  Dr. Marletta Lor  Chief Complaint  Patient presents with  . Constipation    Last bm 12/30    HPI:   Anna Stevenson is a 50 y.o. female presenting today at the request of Orbie Hurst, NP (patient's OBGYN) for constipation. Notably, she was recently diagnosed with left breast cancer with plans for breast conservative surgery, radiation, and likely neoadjuvant chemotherapy. No prior colonoscopy on file.   CT Chest, Abdomen, and Pelvis with contrast 03/29/20 (Care Everywhere):  1. Fullness in the left medial breast with adjacent clip related to  recent biopsy. Small bilateral axillary and upper subpectoral lymph  nodes are relatively symmetric and not pathologically enlarged.  There is a 0.4 cm left internal mammary lymph node which is  technically nonspecific.  2. No findings of distant metastatic disease.  3. 2 mm right mid kidney nonobstructive renal calculus.  4. Small left kidney lower pole cyst.  5. Aortic atherosclerosis.   Today:  Constipation x 1 year.  She is started on MiraLAX which worked for short time.  PCP later prescribed a "pill" which also worked for short time.  She cannot remember the name of the medication that she was tried on.  States she has never taken Linzess or Amitiza.  Not currently taking anything to help with constipation.  States if she drinks alcohol, this helps her have a bowel movement.  She drinks about 3 margaritas 3 times a week.  Bowel movements once every 1-2 weeks.  Last bowel movement was 12/30 (4 days ago).  Also reports she is hemorrhoids that will prolapse.  Intermittent rectal bleeding but none in the last couple of months.  Denies rectal pain, abdominal pain, nausea, vomiting, melena, or weight loss.  No prior colonoscopy.  Currently using hydrocortisone rectal cream as needed, but typically hemorrhoids do not cause her any  trouble.  History of GERD.  Taking famotidine daily which controls her symptoms well.  She has developed dysphagia over the last few months which she states is occurring frequently.  Typically with solid foods.  States items get lodged at the sternal notch.  She has had to regurgitate items in the past.  No prior EGD.  Father had colon cancer twice. Diagnosed in his 79s.  Doing okay now.  Diagnosed with breast cancer in November 2021. She is planning to have breast conserving therapy, radiation, and chemotherapy.  She is in the process of getting treatment arranged.  Past Medical History:  Diagnosis Date  . Asthma   . Breast cancer (HCC) 01/2020   left  . GERD (gastroesophageal reflux disease)   . Migraines   . Neuropathy   . Seasonal allergies     Past Surgical History:  Procedure Laterality Date  . ABDOMINAL HYSTERECTOMY    . CESAREAN SECTION     x2    Current Outpatient Medications  Medication Sig Dispense Refill  . Ascorbic Acid (VITAMIN C PO) Take 1 tablet by mouth daily.    . Cholecalciferol (VITAMIN D PO) Take 1 tablet by mouth daily.    . diclofenac (VOLTAREN) 75 MG EC tablet Take 1 tablet (75 mg total) by mouth 2 (two) times daily. Take with food 14 tablet 0  . famotidine (PEPCID) 20 MG tablet Take 20 mg by mouth at bedtime.    . gabapentin (NEURONTIN) 300 MG capsule Take 300 mg by mouth 3 (three) times daily.    Marland Kitchen  rizatriptan (MAXALT) 10 MG tablet Take 10 mg by mouth as needed for migraine. May repeat in 2 hours if needed    . tiZANidine (ZANAFLEX) 4 MG tablet Take 4 mg by mouth every 6 (six) hours as needed for muscle spasms.    Marland Kitchen topiramate (TOPAMAX) 50 MG tablet Take 50 mg by mouth 2 (two) times daily.     No current facility-administered medications for this visit.    Allergies as of 04/02/2020 - Review Complete 04/02/2020  Allergen Reaction Noted  . Bactrim [sulfamethoxazole-trimethoprim] Hives 11/03/2016    Family History  Problem Relation Age of Onset   . Diabetes Mother   . Stroke Mother   . Stroke Father   . Colon cancer Father        x2. Diagnoased in 16s.     Social History   Socioeconomic History  . Marital status: Single    Spouse name: Not on file  . Number of children: Not on file  . Years of education: Not on file  . Highest education level: Not on file  Occupational History  . Not on file  Tobacco Use  . Smoking status: Never Smoker  . Smokeless tobacco: Never Used  Vaping Use  . Vaping Use: Never used  Substance and Sexual Activity  . Alcohol use: Yes    Comment: 3x/week amt varies- about 3 drinks per occasion   . Drug use: No  . Sexual activity: Not on file  Other Topics Concern  . Not on file  Social History Narrative  . Not on file   Social Determinants of Health   Financial Resource Strain: Not on file  Food Insecurity: Not on file  Transportation Needs: Not on file  Physical Activity: Not on file  Stress: Not on file  Social Connections: Not on file  Intimate Partner Violence: Not on file    Review of Systems: Gen: Denies any fever, chills, cold or flulike symptoms, lightheadedness, dizziness, presyncope, syncope. CV: Denies chest pain or heart palpitations. Resp: Denies shortness of breath or cough. GI: See HPI GU : Denies urinary burning, urinary frequency, urinary hesitancy MS: Admits to back pain. Derm: Denies rash Psych: Denies depression or anxiety, Heme: See HPI  Physical Exam: BP 123/74   Pulse 69   Temp (!) 96.9 F (36.1 C) (Temporal)   Ht 5\' 9"  (1.753 m)   Wt 190 lb 12.8 oz (86.5 kg)   BMI 28.18 kg/m  General:   Alert and oriented. Pleasant and cooperative. Well-nourished and well-developed.  Head:  Normocephalic and atraumatic. Eyes:  Without icterus, sclera clear and conjunctiva pink.  Ears:  Normal auditory acuity. Lungs:  Clear to auscultation bilaterally. No wheezes, rales, or rhonchi. No distress.  Heart:  S1, S2 present without murmurs appreciated.  Abdomen: +BS,  soft, non-tender and non-distended. No HSM noted. No guarding or rebound. No masses appreciated.  Rectal:  Deferred  Msk:  Symmetrical without gross deformities. Normal posture. Extremities:  Without edema. Neurologic:  Alert and  oriented x4;  grossly normal neurologically. Skin:  Intact without significant lesions or rashes. Psych: Normal mood and affect.

## 2020-04-02 ENCOUNTER — Other Ambulatory Visit: Payer: Self-pay

## 2020-04-02 ENCOUNTER — Encounter: Payer: Self-pay | Admitting: Gastroenterology

## 2020-04-02 ENCOUNTER — Ambulatory Visit (INDEPENDENT_AMBULATORY_CARE_PROVIDER_SITE_OTHER): Payer: BC Managed Care – PPO | Admitting: Gastroenterology

## 2020-04-02 DIAGNOSIS — K5900 Constipation, unspecified: Secondary | ICD-10-CM | POA: Diagnosis not present

## 2020-04-02 DIAGNOSIS — C50919 Malignant neoplasm of unspecified site of unspecified female breast: Secondary | ICD-10-CM | POA: Insufficient documentation

## 2020-04-02 DIAGNOSIS — Z1211 Encounter for screening for malignant neoplasm of colon: Secondary | ICD-10-CM | POA: Diagnosis not present

## 2020-04-02 DIAGNOSIS — C50912 Malignant neoplasm of unspecified site of left female breast: Secondary | ICD-10-CM | POA: Diagnosis not present

## 2020-04-02 DIAGNOSIS — R1310 Dysphagia, unspecified: Secondary | ICD-10-CM | POA: Diagnosis not present

## 2020-04-02 DIAGNOSIS — K625 Hemorrhage of anus and rectum: Secondary | ICD-10-CM | POA: Insufficient documentation

## 2020-04-02 NOTE — Assessment & Plan Note (Addendum)
50 year old female with 1 year history of constipation with BMs every 1-2 weeks.  Previously tried and failed MiraLAX as well as some other "pill" that PCP had prescribed.  Not currently taking any medication to help with constipation.  States she has never tried Linzess or Amitiza.  Reports known history of hemorrhoids with occasional BRBPR, but none in the last couple of months.  Denies rectal pain, abdominal pain, nausea, vomiting, melena, or weight loss.  No prior colonoscopy.  She was recently diagnosed with left breast cancer in November 2021 with plans for breast conservative surgery, radiation, neoadjuvant chemotherapy. CT abdomen/pelvis in December 2021 with no evidence of distant metastatic disease.  Hemoglobin 13.1 in December 2021. Father with history of colon cancer x2 diagnosed in his 25s.    Possible idiopathic constipation.  Patient is needs constipation for colon cancer screening and further evaluation of constipation and history of intermittent rectal bleeding.  Plan: Start Linzess 145 mcg daily 30 minutes before first meal.  Samples provided. Requested progress report in 2 weeks.  Explained importance of bowels moving well prior to colonoscopy. Proceed with colonoscopy with propofol with Dr. Marletta Lor in the near future. The risks, benefits, and alternatives have been discussed with the patient in detail. The patient states understanding and desires to proceed.  ASA II Follow-up in office after procedures.

## 2020-04-02 NOTE — Assessment & Plan Note (Addendum)
50 year old female in need of first ever screening colonoscopy.  She was recently diagnosed with left breast cancer November 2021 with plans to undergo breast conservative surgery, radiation, and neoadjuvant chemotherapy.  Also with 1 year history of constipation as discussed below as well as history of intermittent rectal bleeding in the setting of known hemorrhoids discussed above.  No rectal bleeding recently.  Hemoglobin 13.1 in December 2021. CT A/P with contrast December 2021 with no evidence of distant metastatic disease. Denies abdominal pain, rectal pain, melena, or weight loss.  Currently using hydrocortisone rectal cream as needed. Family history significant for father with colon cancer x2, diagnosed in his 7s.  Suspect she may have idiopathic constipation and rectal bleeding likely secondary to benign anorectal source such as hemorrhoids in setting of constipation.  Cannot rule out colon polyps or malignancy.  Plan: Start Linzess 145 mcg daily 30 minutes before first meal.  Samples provided. Requested progress report in 2 weeks.  Explained importance of bowels moving well prior to colonoscopy. Proceed with colonoscopy with propofol with Dr. Marletta Lor in the near future. The risks, benefits, and alternatives have been discussed with the patient in detail. The patient states understanding and desires to proceed.  ASA II Follow-up in office after procedures.

## 2020-04-02 NOTE — Assessment & Plan Note (Addendum)
Addressed under colon cancer screening. 

## 2020-04-02 NOTE — Assessment & Plan Note (Addendum)
50 year old female reporting few month history of dysphagia.  Primarily with solid foods.  Feels items will get lodged at the sternal notch, and she has required food regurgitation in the past.  History of GERD that is well controlled on famotidine daily.   Differentials include esophageal web, ring, stricture in setting of chronic GERD, EOE, and less likely malignancy.  Plan: Proceed with EGD +/-dilation with propofol with Dr. Marletta Lor in the near future. The risks, benefits, and alternatives have been discussed with the patient in detail. The patient states understanding and desires to proceed.  ASA II Avoid tough textures. All meats should be chopped finely. Eat slowly, take small bites, chew thoroughly, and drink plenty of liquids throughout meals. If something gets hung in her esophagus and will not come up or go down, advised she proceed to the emergency room. Follow-up in office after procedures.

## 2020-04-02 NOTE — Assessment & Plan Note (Addendum)
History of intermittent blood per rectum in the setting of constipation and known hemorrhoids that prolapse.  Has hydrocortisone rectal cream to use as needed.  Denies rectal pain.  No rectal bleeding in the last couple of months.  Recent CBC in December 2021 with hemoglobin 13.1.  No prior colonoscopy.  Notably, patient was recently diagnosed with left breast cancer in November 2021 with plans for breast conservative therapy, radiation, and neoadjuvant chemotherapy.  CT A/P with contrast December 2021 with no evidence of distant metastatic disease.  Rectal bleeding likely secondary to benign anorectal source such as hemorrhoids in setting of constipation.  Cannot rule out colon polyps or malignancy.  She will need first ever colonoscopy.  Plan: Start Linzess 145 mcg daily 30 minutes before breakfast.  Samples provided.  Requested progress report in 2 weeks.  Explained the importance of bowels moving well prior to colonoscopy. Proceed with colonoscopy with propofol with Dr. Marletta Lor in the near future.The risks, benefits, and alternatives have been discussed with the patient in detail. The patient states understanding and desires to proceed.  ASA II May continue using hydrocortisone rectal cream twice daily as needed for 7-10 days at a time. Follow-up in office after procedures.

## 2020-04-02 NOTE — Patient Instructions (Addendum)
Please start Linzess 145 mcg daily 30 minutes before your first meal.  This medication may cause diarrhea for the first few days, but should resolve over the first week.   Please call in 2 weeks with a progress report regarding constipation.  Is very important that your constipation is well managed prior to your colonoscopy to ensure that you have a good prep.  We will get you scheduled for a colonoscopy and upper endoscopy with possible dilation of your esophagus in the near future with Dr. Marletta Lor.  To help with your trouble swallowing: Avoid tough textures. All meats should be chopped finely. Eat slowly, take small bites, chew thoroughly, and drink plenty of liquids throughout meals. If something gets hung in your esophagus and will not come up or go down, you should proceed to the emergency room.  We will plan to follow-up with you in the office after procedures.  Ermalinda Memos, PA-C [facility]

## 2020-04-02 NOTE — Progress Notes (Signed)
Cc'ed to pcp °

## 2020-04-12 ENCOUNTER — Telehealth: Payer: Self-pay | Admitting: Internal Medicine

## 2020-04-12 MED ORDER — LINACLOTIDE 145 MCG PO CAPS: 145.0000 ug | ORAL_CAPSULE | Freq: Every day | ORAL | 5 refills | Status: AC

## 2020-04-12 NOTE — Telephone Encounter (Signed)
Samples of Linzess is working and she needs a prescription called into [facility].

## 2020-04-12 NOTE — Telephone Encounter (Signed)
Prescription for Linzess sent to pharmacy.

## 2020-04-13 NOTE — Telephone Encounter (Signed)
Phoned and LM on vm regarding her Rx for Linzess has been sent in to her pharmacy.

## 2020-04-30 ENCOUNTER — Telehealth: Payer: Self-pay | Admitting: *Deleted

## 2020-04-30 NOTE — Telephone Encounter (Signed)
Called pt, LMOVM. We need to reschedule procedure scheduled for 05/04/20 w/ Dr. Abbey Chatters.

## 2020-05-01 NOTE — Telephone Encounter (Signed)
Called pt. She has been r/s'd to 3/11 am appt. Aware endo will call days prior with arrival time. Advised will mail new instructions with new covid appt.

## 2020-05-02 ENCOUNTER — Other Ambulatory Visit (HOSPITAL_COMMUNITY)
Admission: RE | Admit: 2020-05-02 | Discharge: 2020-05-02 | Disposition: A | Discharge status: Home / Self Care | Payer: BC Managed Care – PPO | Source: Ambulatory Visit | Attending: Internal Medicine | Admitting: Internal Medicine

## 2020-05-29 ENCOUNTER — Other Ambulatory Visit: Payer: Self-pay

## 2020-05-29 ENCOUNTER — Emergency Department (HOSPITAL_COMMUNITY)
Admission: EM | Admit: 2020-05-29 | Discharge: 2020-05-29 | Disposition: A | Discharge status: Home / Self Care | Payer: BC Managed Care – PPO | Attending: Emergency Medicine | Admitting: Emergency Medicine

## 2020-05-29 ENCOUNTER — Emergency Department (HOSPITAL_COMMUNITY): Payer: BC Managed Care – PPO

## 2020-05-29 DIAGNOSIS — T451X5A Adverse effect of antineoplastic and immunosuppressive drugs, initial encounter: Secondary | ICD-10-CM | POA: Diagnosis not present

## 2020-05-29 DIAGNOSIS — C50912 Malignant neoplasm of unspecified site of left female breast: Secondary | ICD-10-CM | POA: Insufficient documentation

## 2020-05-29 DIAGNOSIS — R112 Nausea with vomiting, unspecified: Secondary | ICD-10-CM

## 2020-05-29 DIAGNOSIS — Z20822 Contact with and (suspected) exposure to covid-19: Secondary | ICD-10-CM | POA: Diagnosis not present

## 2020-05-29 DIAGNOSIS — R059 Cough, unspecified: Secondary | ICD-10-CM | POA: Insufficient documentation

## 2020-05-29 DIAGNOSIS — D701 Agranulocytosis secondary to cancer chemotherapy: Secondary | ICD-10-CM | POA: Insufficient documentation

## 2020-05-29 DIAGNOSIS — R197 Diarrhea, unspecified: Secondary | ICD-10-CM | POA: Diagnosis not present

## 2020-05-29 DIAGNOSIS — J45909 Unspecified asthma, uncomplicated: Secondary | ICD-10-CM | POA: Diagnosis not present

## 2020-05-29 LAB — COMPREHENSIVE METABOLIC PANEL
ALT: 18 U/L (ref 0–44)
AST: 11 U/L — ABNORMAL LOW (ref 15–41)
Albumin: 3.3 g/dL — ABNORMAL LOW (ref 3.5–5.0)
Alkaline Phosphatase: 47 U/L (ref 38–126)
Anion gap: 9 (ref 5–15)
BUN: 12 mg/dL (ref 6–20)
CO2: 24 mmol/L (ref 22–32)
Calcium: 8.3 mg/dL — ABNORMAL LOW (ref 8.9–10.3)
Chloride: 100 mmol/L (ref 98–111)
Creatinine, Ser: 0.62 mg/dL (ref 0.44–1.00)
GFR, Estimated: >60 mL/min (ref >60)
Glucose, Bld: 120 mg/dL — ABNORMAL HIGH (ref 70–99)
Potassium: 3.8 mmol/L (ref 3.5–5.1)
Sodium: 133 mmol/L — ABNORMAL LOW (ref 135–145)
Total Bilirubin: 0.5 mg/dL (ref 0.3–1.2)
Total Protein: 6.4 g/dL — ABNORMAL LOW (ref 6.5–8.1)

## 2020-05-29 LAB — CBC WITH DIFFERENTIAL/PLATELET
Abs Immature Granulocytes: 0.01 10*3/uL (ref 0.00–0.07)
Basophils Absolute: 0 10*3/uL (ref 0.0–0.1)
Basophils Relative: 0 %
Eosinophils Absolute: 0 10*3/uL (ref 0.0–0.5)
Eosinophils Relative: 2 %
HCT: 33 % — ABNORMAL LOW (ref 36.0–46.0)
Hemoglobin: 11.3 g/dL — ABNORMAL LOW (ref 12.0–15.0)
Immature Granulocytes: 2 %
Lymphocytes Relative: 56 %
Lymphs Abs: 0.4 10*3/uL — ABNORMAL LOW (ref 0.7–4.0)
MCH: 32.9 pg (ref 26.0–34.0)
MCHC: 34.2 g/dL (ref 30.0–36.0)
MCV: 96.2 fL (ref 80.0–100.0)
Monocytes Absolute: 0.2 10*3/uL (ref 0.1–1.0)
Monocytes Relative: 32 %
Neutro Abs: 0.1 10*3/uL — CL (ref 1.7–7.7)
Neutrophils Relative %: 8 %
Platelets: 120 10*3/uL — ABNORMAL LOW (ref 150–400)
RBC: 3.43 MIL/uL — ABNORMAL LOW (ref 3.87–5.11)
RDW: 13.2 % (ref 11.5–15.5)
WBC: 0.6 10*3/uL — CL (ref 4.0–10.5)
nRBC: 0 % (ref 0.0–0.2)

## 2020-05-29 LAB — CULTURE, BLOOD (SINGLE)

## 2020-05-29 LAB — URINALYSIS, ROUTINE W REFLEX MICROSCOPIC
Bilirubin Urine: NEGATIVE
Glucose, UA: NEGATIVE mg/dL
Hgb urine dipstick: NEGATIVE
Ketones, ur: NEGATIVE mg/dL
Leukocytes,Ua: NEGATIVE
Nitrite: NEGATIVE
Protein, ur: NEGATIVE mg/dL
Specific Gravity, Urine: 1.018 (ref 1.005–1.030)
pH: 7 (ref 5.0–8.0)

## 2020-05-29 LAB — PHOSPHORUS: Phosphorus: 2.3 mg/dL — ABNORMAL LOW (ref 2.5–4.6)

## 2020-05-29 LAB — RESP PANEL BY RT-PCR (FLU A&B, COVID) ARPGX2
Influenza A by PCR: NEGATIVE
Influenza B by PCR: NEGATIVE
SARS Coronavirus 2 by RT PCR: NEGATIVE

## 2020-05-29 LAB — URIC ACID: Uric Acid, Serum: 3.1 mg/dL (ref 2.5–7.1)

## 2020-05-29 MED ORDER — HEPARIN SOD (PORK) LOCK FLUSH 100 UNIT/ML IV SOLN
500.0000 [IU] | Freq: Once | INTRAVENOUS | Status: AC
  Administered 2020-05-29: 500 [IU]
  Filled 2020-05-29: qty 5

## 2020-05-29 MED ORDER — SODIUM CHLORIDE 0.9 % IV BOLUS
1000.0000 mL | Freq: Once | INTRAVENOUS | Status: AC
  Administered 2020-05-29: 1000 mL via INTRAVENOUS

## 2020-05-29 MED ORDER — PROCHLORPERAZINE MALEATE 10 MG PO TABS: 10.0000 mg | ORAL_TABLET | Freq: Two times a day (BID) | ORAL | 0 refills | Status: DC | PRN

## 2020-05-29 NOTE — ED Notes (Signed)
Pt to remain in room for protective precautions until her daughter arrives to take her home.

## 2020-05-29 NOTE — Discharge Instructions (Addendum)
You were evaluated in the Emergency Department and after careful evaluation, we did not find any emergent condition requiring admission or further testing in the hospital.  Your symptoms seem to be due to side effects of chemotherapy.  Your testing showed low white blood cell count, which is expected due to the chemotherapy.  Otherwise no emergencies or significant abnormalities.  It is very important that you follow-up closely with your oncologist this week.  As discussed, with any fever please return to the emergency department.  Please return to the Emergency Department if you experience any worsening of your condition.   Thank you for allowing [place] to be a part of your care.

## 2020-05-29 NOTE — ED Notes (Signed)
Date and time results received: 05/29/20 0530 (use smartphrase ".now" to insert current time)  Test: absolute neutrophils Critical Value: 0.1  Name of Provider Notified: Dr Sedonia Small  Orders Received? Or Actions Taken?: Actions Taken: no orders received

## 2020-05-29 NOTE — ED Notes (Signed)
Date and time results received: 05/29/20 0528 (use smartphrase ".now" to insert current time)  Test: wbc Critical Value: 0.6  Name of Provider Notified: Dr Sedonia Small  Orders Received? Or Actions Taken?: Actions Taken: no orders received

## 2020-05-29 NOTE — ED Provider Notes (Signed)
[facility] Emergency Department Provider Note MRN:  [id number]  Arrival date & time: 05/29/20     Chief Complaint   Emesis, Generalized Body Aches, and Diarrhea   History of Present Illness   Anna Stevenson is a 50 y.o. year-old female with a history of breast cancer on chemotherapy presenting to the ED with chief complaint of emesis.  2 or 3 days of nausea, only 1 or 2 episodes of nonbloody nonbilious emesis.  Copious watery diarrhea.  Denies abdominal pain, no chest pain or shortness of breath.  Mild cough.  Denies fever.  Body aches.  Symptoms are constant, moderate, no exacerbating or alleviating factors.  Review of Systems  A complete 10 system review of systems was obtained and all systems are negative except as noted in the HPI and PMH.   Patient's Health History    Past Medical History:  Diagnosis Date  . Asthma   . Breast cancer ([place]) 01/2020   left  . GERD (gastroesophageal reflux disease)   . Migraines   . Neuropathy   . Seasonal allergies     Past Surgical History:  Procedure Laterality Date  . ABDOMINAL HYSTERECTOMY    . CESAREAN SECTION     x2    Family History  Problem Relation Age of Onset  . Diabetes Mother   . Stroke Mother   . Stroke Father   . Colon cancer Father        x2. Diagnoased in 5s.     Social History   Socioeconomic History  . Marital status: Single    Spouse name: Not on file  . Number of children: Not on file  . Years of education: Not on file  . Highest education level: Not on file  Occupational History  . Not on file  Tobacco Use  . Smoking status: Never Smoker  . Smokeless tobacco: Never Used  Vaping Use  . Vaping Use: Never used  Substance and Sexual Activity  . Alcohol use: Yes    Comment: 3x/week amt varies- about 3 drinks per occasion   . Drug use: No  . Sexual activity: Not on file  Other Topics Concern  . Not on file  Social History Narrative  . Not on file   Social Determinants  of Health   Financial Resource Strain: Not on file  Food Insecurity: Not on file  Transportation Needs: Not on file  Physical Activity: Not on file  Stress: Not on file  Social Connections: Not on file  Intimate Partner Violence: Not on file     Physical Exam   Vitals:   05/29/20 0353 05/29/20 0515  BP: 105/67 120/70  Pulse: 88 80  Resp: 14 16  Temp: 98.8 F (37.1 C)   SpO2: 97% 100%    CONSTITUTIONAL: Well-appearing, NAD NEURO:  Alert and oriented x 3, no focal deficits EYES:  eyes equal and reactive ENT/NECK:  no LAD, no JVD CARDIO: Regular rate, well-perfused, normal S1 and S2 PULM:  CTAB no wheezing or rhonchi GI/GU:  normal bowel sounds, non-distended, non-tender MSK/SPINE:  No gross deformities, no edema SKIN:  no rash, atraumatic PSYCH:  Appropriate speech and behavior  *Additional and/or pertinent findings included in MDM below  Diagnostic and Interventional Summary    EKG Interpretation  Date/Time:    Ventricular Rate:    PR Interval:    QRS Duration:   QT Interval:    QTC Calculation:   R Axis:     Text Interpretation:  Labs Reviewed  CBC WITH DIFFERENTIAL/PLATELET - Abnormal; Notable for the following components:      Result Value   WBC 0.6 (*)    RBC 3.43 (*)    Hemoglobin 11.3 (*)    HCT 33.0 (*)    Platelets 120 (*)    Neutro Abs 0.1 (*)    Lymphs Abs 0.4 (*)    All other components within normal limits  PHOSPHORUS - Abnormal; Notable for the following components:   Phosphorus 2.3 (*)    All other components within normal limits  COMPREHENSIVE METABOLIC PANEL - Abnormal; Notable for the following components:   Sodium 133 (*)    Glucose, Bld 120 (*)    Calcium 8.3 (*)    Total Protein 6.4 (*)    Albumin 3.3 (*)    AST 11 (*)    All other components within normal limits  RESP PANEL BY RT-PCR (FLU A&B, COVID) ARPGX2  CULTURE, BLOOD (SINGLE)  GASTROINTESTINAL PANEL BY PCR, STOOL (REPLACES STOOL CULTURE)  C DIFFICILE (CDIFF)  QUICK SCRN (NO PCR REFLEX)  URIC ACID  URINALYSIS, ROUTINE W REFLEX MICROSCOPIC  PATHOLOGIST SMEAR REVIEW    DG Chest Port 1 View  Final Result      Medications  sodium chloride 0.9 % bolus 1,000 mL (0 mLs Intravenous Stopped 05/29/20 0537)     Procedures  /  Critical Care Procedures  ED Course and Medical Decision Making  I have reviewed the triage vital signs, the nursing notes, and pertinent available records from the EMR.  Listed above are laboratory and imaging tests that I personally ordered, reviewed, and interpreted and then considered in my medical decision making (see below for details).  Patient has recently started chemotherapy 2 weeks ago, is receiving doxorubicin, cyclophosphamide, paclitaxel, is also taking oral Decadron.  Here with nausea, vomiting, diarrhea, body aches.  Considering metabolic disarray, dehydration, tumor lysis syndrome, infection given her immunocompromise state.  Overall is well-appearing with reassuring vital signs, awaiting labs.     Labs reveal neutropenia but are otherwise reassuring, no evidence of tumor lysis or metabolic disarray of any kind.  Patient feeling a bit better after fluids, still with aches and pains, but no nausea vomiting or diarrhea since arriving in the emergency department.  No fever and so no need for admission for neutropenic fever.  Patient has close follow-up with her oncologist later this week.  She is well-appearing, there is no evidence of sepsis, currently no indication for further testing or admission.  Does need to have strict return precautions for fever or worsening of condition and we discussed this together.  I explained to her that her symptoms may be due to side effects of the chemotherapy but if they are related to infection she may get worse and need to come back.  She seems reliable, she lives close by, she agrees to come back with any worsening condition.  Barth Kirks. Sedonia Small, MD [place] [email or web address]  Final Clinical Impressions(s) / ED Diagnoses     ICD-10-CM   1. Nausea vomiting and diarrhea  R11.2    R19.7   2. Chemotherapy induced neutropenia (HCC)  D70.1    T45.1X5A     ED Discharge Orders         Ordered    prochlorperazine (COMPAZINE) 10 MG tablet  2 times daily PRN        05/29/20 0618           Discharge  Instructions Discussed with and Provided to Patient:     Discharge Instructions     You were evaluated in the Emergency Department and after careful evaluation, we did not find any emergent condition requiring admission or further testing in the hospital.  Your symptoms seem to be due to side effects of chemotherapy.  Your testing showed low white blood cell count, which is expected due to the chemotherapy.  Otherwise no emergencies or significant abnormalities.  It is very important that you follow-up closely with your oncologist this week.  As discussed, with any fever please return to the emergency department.  Please return to the Emergency Department if you experience any worsening of your condition.   Thank you for allowing [place] to be a part of your care.       [name], MD 05/29/20 [phone]

## 2020-05-29 NOTE — ED Triage Notes (Signed)
Pt reports fall on Saturday and generalized weakness.

## 2020-05-29 NOTE — ED Triage Notes (Signed)
Pt reports generalized body aches that started 05/27/2020 with vomiting x1 day, diarrhea x 2 days. Pt had chemotherapy treatment on Tuesday for breast cancer. Denies fever.

## 2020-05-30 LAB — PATHOLOGIST SMEAR REVIEW

## 2020-05-31 LAB — CULTURE, BLOOD (SINGLE): Special Requests: ADEQUATE

## 2020-06-02 LAB — CULTURE, BLOOD (SINGLE)

## 2020-06-03 LAB — CULTURE, BLOOD (SINGLE): Culture: NO GROWTH

## 2020-06-04 ENCOUNTER — Encounter (HOSPITAL_COMMUNITY): Payer: Self-pay

## 2020-06-05 ENCOUNTER — Telehealth: Payer: Self-pay | Admitting: Internal Medicine

## 2020-06-05 NOTE — Telephone Encounter (Signed)
PATIENT CALLED TO CANCEL HER PROCEDURE.  Her cancer doctor wants her to wait until after her chemo is complete.She will call back in May

## 2020-06-05 NOTE — Telephone Encounter (Signed)
Noted  

## 2020-06-05 NOTE — Telephone Encounter (Signed)
Called pt and aware will cancel procedure for now. She will call back when able to reschedule. Message sent to endo as well  FYI to [employer]

## 2020-06-06 ENCOUNTER — Other Ambulatory Visit (HOSPITAL_COMMUNITY): Admission: RE | Admit: 2020-06-06 | Payer: BC Managed Care – PPO | Source: Ambulatory Visit

## 2020-06-08 ENCOUNTER — Encounter (HOSPITAL_COMMUNITY): Admission: RE | Payer: Self-pay | Source: Home / Self Care

## 2020-06-08 ENCOUNTER — Ambulatory Visit (HOSPITAL_COMMUNITY): Admission: RE | Admit: 2020-06-08 | Payer: BC Managed Care – PPO | Source: Home / Self Care

## 2020-06-08 SURGERY — COLONOSCOPY WITH PROPOFOL
Anesthesia: Monitor Anesthesia Care

## 2020-06-26 ENCOUNTER — Encounter (HOSPITAL_COMMUNITY): Payer: Self-pay | Admitting: Emergency Medicine

## 2020-06-26 ENCOUNTER — Inpatient Hospital Stay (HOSPITAL_COMMUNITY): Payer: BC Managed Care – PPO

## 2020-06-26 ENCOUNTER — Inpatient Hospital Stay (HOSPITAL_COMMUNITY)
Admission: EM | Admit: 2020-06-26 | Discharge: 2020-06-28 | DRG: 314 | Disposition: A | Discharge status: Home / Self Care | Payer: BC Managed Care – PPO | Attending: Internal Medicine | Admitting: Internal Medicine

## 2020-06-26 ENCOUNTER — Other Ambulatory Visit: Payer: Self-pay

## 2020-06-26 DIAGNOSIS — Z800 Family history of malignant neoplasm of digestive organs: Secondary | ICD-10-CM

## 2020-06-26 DIAGNOSIS — C50912 Malignant neoplasm of unspecified site of left female breast: Secondary | ICD-10-CM | POA: Diagnosis not present

## 2020-06-26 DIAGNOSIS — E860 Dehydration: Secondary | ICD-10-CM

## 2020-06-26 DIAGNOSIS — T451X5A Adverse effect of antineoplastic and immunosuppressive drugs, initial encounter: Secondary | ICD-10-CM | POA: Diagnosis present

## 2020-06-26 DIAGNOSIS — F32A Depression, unspecified: Secondary | ICD-10-CM | POA: Diagnosis present

## 2020-06-26 DIAGNOSIS — E871 Hypo-osmolality and hyponatremia: Secondary | ICD-10-CM

## 2020-06-26 DIAGNOSIS — C50919 Malignant neoplasm of unspecified site of unspecified female breast: Secondary | ICD-10-CM | POA: Diagnosis present

## 2020-06-26 DIAGNOSIS — R112 Nausea with vomiting, unspecified: Secondary | ICD-10-CM | POA: Diagnosis present

## 2020-06-26 DIAGNOSIS — K219 Gastro-esophageal reflux disease without esophagitis: Secondary | ICD-10-CM | POA: Diagnosis present

## 2020-06-26 DIAGNOSIS — I959 Hypotension, unspecified: Principal | ICD-10-CM | POA: Diagnosis present

## 2020-06-26 DIAGNOSIS — I9589 Other hypotension: Secondary | ICD-10-CM

## 2020-06-26 DIAGNOSIS — Z823 Family history of stroke: Secondary | ICD-10-CM

## 2020-06-26 DIAGNOSIS — Z853 Personal history of malignant neoplasm of breast: Secondary | ICD-10-CM

## 2020-06-26 DIAGNOSIS — E869 Volume depletion, unspecified: Secondary | ICD-10-CM | POA: Diagnosis present

## 2020-06-26 DIAGNOSIS — J45909 Unspecified asthma, uncomplicated: Secondary | ICD-10-CM | POA: Diagnosis present

## 2020-06-26 DIAGNOSIS — G43909 Migraine, unspecified, not intractable, without status migrainosus: Secondary | ICD-10-CM | POA: Diagnosis present

## 2020-06-26 DIAGNOSIS — R739 Hyperglycemia, unspecified: Secondary | ICD-10-CM | POA: Diagnosis present

## 2020-06-26 DIAGNOSIS — D61810 Antineoplastic chemotherapy induced pancytopenia: Secondary | ICD-10-CM | POA: Diagnosis present

## 2020-06-26 DIAGNOSIS — R109 Unspecified abdominal pain: Secondary | ICD-10-CM | POA: Diagnosis present

## 2020-06-26 DIAGNOSIS — G629 Polyneuropathy, unspecified: Secondary | ICD-10-CM | POA: Diagnosis present

## 2020-06-26 DIAGNOSIS — E441 Mild protein-calorie malnutrition: Secondary | ICD-10-CM | POA: Diagnosis present

## 2020-06-26 DIAGNOSIS — R110 Nausea: Secondary | ICD-10-CM

## 2020-06-26 DIAGNOSIS — E8809 Other disorders of plasma-protein metabolism, not elsewhere classified: Secondary | ICD-10-CM | POA: Diagnosis present

## 2020-06-26 DIAGNOSIS — Z95828 Presence of other vascular implants and grafts: Secondary | ICD-10-CM | POA: Diagnosis not present

## 2020-06-26 DIAGNOSIS — Z20822 Contact with and (suspected) exposure to covid-19: Secondary | ICD-10-CM | POA: Diagnosis present

## 2020-06-26 DIAGNOSIS — F419 Anxiety disorder, unspecified: Secondary | ICD-10-CM | POA: Diagnosis present

## 2020-06-26 DIAGNOSIS — D701 Agranulocytosis secondary to cancer chemotherapy: Secondary | ICD-10-CM | POA: Diagnosis present

## 2020-06-26 DIAGNOSIS — D709 Neutropenia, unspecified: Secondary | ICD-10-CM | POA: Diagnosis not present

## 2020-06-26 DIAGNOSIS — E46 Unspecified protein-calorie malnutrition: Secondary | ICD-10-CM

## 2020-06-26 DIAGNOSIS — R000 Tachycardia, unspecified: Secondary | ICD-10-CM | POA: Diagnosis present

## 2020-06-26 DIAGNOSIS — R197 Diarrhea, unspecified: Secondary | ICD-10-CM | POA: Diagnosis present

## 2020-06-26 DIAGNOSIS — D61818 Other pancytopenia: Secondary | ICD-10-CM

## 2020-06-26 DIAGNOSIS — Z882 Allergy status to sulfonamides status: Secondary | ICD-10-CM

## 2020-06-26 DIAGNOSIS — R5081 Fever presenting with conditions classified elsewhere: Secondary | ICD-10-CM | POA: Diagnosis present

## 2020-06-26 DIAGNOSIS — Z888 Allergy status to other drugs, medicaments and biological substances status: Secondary | ICD-10-CM

## 2020-06-26 DIAGNOSIS — R531 Weakness: Secondary | ICD-10-CM | POA: Diagnosis not present

## 2020-06-26 DIAGNOSIS — Z833 Family history of diabetes mellitus: Secondary | ICD-10-CM | POA: Diagnosis not present

## 2020-06-26 DIAGNOSIS — M791 Myalgia, unspecified site: Secondary | ICD-10-CM | POA: Diagnosis present

## 2020-06-26 DIAGNOSIS — Z79899 Other long term (current) drug therapy: Secondary | ICD-10-CM

## 2020-06-26 DIAGNOSIS — Z6825 Body mass index (BMI) 25.0-25.9, adult: Secondary | ICD-10-CM

## 2020-06-26 LAB — RESP PANEL BY RT-PCR (FLU A&B, COVID) ARPGX2
Influenza A by PCR: NEGATIVE
Influenza B by PCR: NEGATIVE
SARS Coronavirus 2 by RT PCR: NEGATIVE

## 2020-06-26 LAB — URINALYSIS, ROUTINE W REFLEX MICROSCOPIC
Glucose, UA: NEGATIVE mg/dL
Hgb urine dipstick: NEGATIVE
Ketones, ur: NEGATIVE mg/dL
Leukocytes,Ua: NEGATIVE
Nitrite: NEGATIVE
Protein, ur: 100 mg/dL — AB
Specific Gravity, Urine: 1.019 (ref 1.005–1.030)
pH: 7 (ref 5.0–8.0)

## 2020-06-26 LAB — CBC WITH DIFFERENTIAL/PLATELET
Abs Immature Granulocytes: 0.01 10*3/uL (ref 0.00–0.07)
Basophils Absolute: 0 10*3/uL (ref 0.0–0.1)
Basophils Relative: 0 %
Eosinophils Absolute: 0 10*3/uL (ref 0.0–0.5)
Eosinophils Relative: 0 %
HCT: 30.6 % — ABNORMAL LOW (ref 36.0–46.0)
Hemoglobin: 10.3 g/dL — ABNORMAL LOW (ref 12.0–15.0)
Immature Granulocytes: 3 %
Lymphocytes Relative: 73 %
Lymphs Abs: 0.3 10*3/uL — ABNORMAL LOW (ref 0.7–4.0)
MCH: 32.7 pg (ref 26.0–34.0)
MCHC: 33.7 g/dL (ref 30.0–36.0)
MCV: 97.1 fL (ref 80.0–100.0)
Monocytes Absolute: 0.1 10*3/uL (ref 0.1–1.0)
Monocytes Relative: 21 %
Neutro Abs: 0 10*3/uL — CL (ref 1.7–7.7)
Neutrophils Relative %: 3 %
Platelets: 95 10*3/uL — ABNORMAL LOW (ref 150–400)
RBC: 3.15 MIL/uL — ABNORMAL LOW (ref 3.87–5.11)
RDW: 14.4 % (ref 11.5–15.5)
WBC: 0.3 10*3/uL — CL (ref 4.0–10.5)
nRBC: 0 % (ref 0.0–0.2)

## 2020-06-26 LAB — COMPREHENSIVE METABOLIC PANEL
ALT: 24 U/L (ref 0–44)
AST: 12 U/L — ABNORMAL LOW (ref 15–41)
Albumin: 3.4 g/dL — ABNORMAL LOW (ref 3.5–5.0)
Alkaline Phosphatase: 46 U/L (ref 38–126)
Anion gap: 9 (ref 5–15)
BUN: 15 mg/dL (ref 6–20)
CO2: 23 mmol/L (ref 22–32)
Calcium: 8.9 mg/dL (ref 8.9–10.3)
Chloride: 99 mmol/L (ref 98–111)
Creatinine, Ser: 0.86 mg/dL (ref 0.44–1.00)
GFR, Estimated: >60 mL/min (ref >60)
Glucose, Bld: 156 mg/dL — ABNORMAL HIGH (ref 70–99)
Potassium: 4 mmol/L (ref 3.5–5.1)
Sodium: 131 mmol/L — ABNORMAL LOW (ref 135–145)
Total Bilirubin: 0.8 mg/dL (ref 0.3–1.2)
Total Protein: 6.8 g/dL (ref 6.5–8.1)

## 2020-06-26 LAB — HEMOGLOBIN A1C
Hgb A1c MFr Bld: 7.1 % — ABNORMAL HIGH (ref 4.8–5.6)
Mean Plasma Glucose: 157.07 mg/dL

## 2020-06-26 LAB — URINALYSIS, COMPLETE (UACMP) WITH MICROSCOPIC
Bacteria, UA: NONE SEEN
Bilirubin Urine: NEGATIVE
Glucose, UA: NEGATIVE mg/dL
Hgb urine dipstick: NEGATIVE
Ketones, ur: NEGATIVE mg/dL
Leukocytes,Ua: NEGATIVE
Nitrite: NEGATIVE
Protein, ur: NEGATIVE mg/dL
Specific Gravity, Urine: 1.013 (ref 1.005–1.030)
pH: 6 (ref 5.0–8.0)

## 2020-06-26 LAB — LIPASE, BLOOD: Lipase: 26 U/L (ref 11–51)

## 2020-06-26 LAB — FOLATE: Folate: 10.5 ng/mL (ref >5.9)

## 2020-06-26 LAB — PROCALCITONIN: Procalcitonin: <0.1 ng/mL

## 2020-06-26 LAB — LACTIC ACID, PLASMA: Lactic Acid, Venous: 1.4 mmol/L (ref 0.5–1.9)

## 2020-06-26 LAB — HIV ANTIBODY (ROUTINE TESTING W REFLEX): HIV Screen 4th Generation wRfx: NONREACTIVE

## 2020-06-26 LAB — VITAMIN B12: Vitamin B-12: 3125 pg/mL — ABNORMAL HIGH (ref 180–914)

## 2020-06-26 MED ORDER — SODIUM CHLORIDE 0.9 % IV SOLN: INTRAVENOUS | Status: DC

## 2020-06-26 MED ORDER — SODIUM CHLORIDE 0.9 % IV BOLUS
1000.0000 mL | Freq: Once | INTRAVENOUS | Status: AC
  Administered 2020-06-26: 1000 mL via INTRAVENOUS

## 2020-06-26 MED ORDER — VANCOMYCIN HCL 10 G IV SOLR
1750.0000 mg | Freq: Once | INTRAVENOUS | Status: DC
  Filled 2020-06-26: qty 1750

## 2020-06-26 MED ORDER — SODIUM CHLORIDE 0.9 % IV SOLN
2.0000 g | Freq: Three times a day (TID) | INTRAVENOUS | Status: DC
  Administered 2020-06-26 – 2020-06-28 (×5): 2 g via INTRAVENOUS
  Filled 2020-06-26 (×6): qty 2

## 2020-06-26 MED ORDER — ACETAMINOPHEN 325 MG PO TABS
650.0000 mg | ORAL_TABLET | Freq: Four times a day (QID) | ORAL | Status: DC | PRN
  Administered 2020-06-26: 650 mg via ORAL
  Filled 2020-06-26: qty 2

## 2020-06-26 MED ORDER — VANCOMYCIN HCL IN DEXTROSE 1-5 GM/200ML-% IV SOLN
1000.0000 mg | Freq: Two times a day (BID) | INTRAVENOUS | Status: DC
  Administered 2020-06-27 – 2020-06-28 (×3): 1000 mg via INTRAVENOUS
  Filled 2020-06-26 (×4): qty 200

## 2020-06-26 MED ORDER — VANCOMYCIN HCL 750 MG/150ML IV SOLN
750.0000 mg | Freq: Once | INTRAVENOUS | Status: AC
  Administered 2020-06-26: 750 mg via INTRAVENOUS
  Filled 2020-06-26: qty 150

## 2020-06-26 MED ORDER — VANCOMYCIN HCL IN DEXTROSE 1-5 GM/200ML-% IV SOLN
1000.0000 mg | Freq: Once | INTRAVENOUS | Status: AC
  Administered 2020-06-26: 1000 mg via INTRAVENOUS
  Filled 2020-06-26: qty 200

## 2020-06-26 MED ORDER — ENSURE ENLIVE PO LIQD
237.0000 mL | Freq: Two times a day (BID) | ORAL | Status: DC
  Administered 2020-06-26 – 2020-06-28 (×5): 237 mL via ORAL
  Filled 2020-06-26 (×4): qty 237

## 2020-06-26 MED ORDER — TBO-FILGRASTIM 300 MCG/0.5ML ~~LOC~~ SOSY
300.0000 ug | PREFILLED_SYRINGE | Freq: Every day | SUBCUTANEOUS | Status: DC
  Administered 2020-06-26: 300 ug via SUBCUTANEOUS
  Filled 2020-06-26 (×4): qty 0.5

## 2020-06-26 MED ORDER — SODIUM CHLORIDE 0.9 % IV SOLN: Freq: Once | INTRAVENOUS | Status: AC

## 2020-06-26 MED ORDER — PANTOPRAZOLE SODIUM 40 MG IV SOLR
40.0000 mg | INTRAVENOUS | Status: DC
  Administered 2020-06-26 – 2020-06-28 (×3): 40 mg via INTRAVENOUS
  Filled 2020-06-26 (×3): qty 40

## 2020-06-26 MED ORDER — METOCLOPRAMIDE HCL 5 MG/ML IJ SOLN
10.0000 mg | Freq: Once | INTRAMUSCULAR | Status: AC
  Administered 2020-06-26: 10 mg via INTRAVENOUS

## 2020-06-26 MED ORDER — CITALOPRAM HYDROBROMIDE 20 MG PO TABS
20.0000 mg | ORAL_TABLET | Freq: Every day | ORAL | Status: DC
  Administered 2020-06-26 – 2020-06-28 (×3): 20 mg via ORAL
  Filled 2020-06-26 (×3): qty 1

## 2020-06-26 MED ORDER — CHLORHEXIDINE GLUCONATE CLOTH 2 % EX PADS
6.0000 | MEDICATED_PAD | Freq: Every day | CUTANEOUS | Status: DC
  Administered 2020-06-26 – 2020-06-28 (×3): 6 via TOPICAL

## 2020-06-26 MED ORDER — PROCHLORPERAZINE EDISYLATE 10 MG/2ML IJ SOLN: 10.0000 mg | Freq: Four times a day (QID) | INTRAMUSCULAR | Status: DC | PRN

## 2020-06-26 NOTE — ED Notes (Signed)
Ambulatory to restroom

## 2020-06-26 NOTE — ED Notes (Addendum)
WBC 0.3 Absolute Neuropil  0.0  Reported to EDP Pollina at this time  No new orders at this time

## 2020-06-26 NOTE — ED Provider Notes (Signed)
[facility] Provider Note   CSN: [id number] Arrival date & time: 06/26/20  0211     History Chief Complaint  Patient presents with  . Nausea    Anna Stevenson is a 50 y.o. female.  Patient reports nausea, abdominal pain, diarrhea, body aches.  Symptoms began earlier today.        Past Medical History:  Diagnosis Date  . Asthma   . Breast cancer ([place]) 01/2020   left  . GERD (gastroesophageal reflux disease)   . Migraines   . Neuropathy   . Seasonal allergies     Patient Active Problem List   Diagnosis Date Noted  . Constipation 04/02/2020  . Dysphagia 04/02/2020  . Breast cancer ([place]) 04/02/2020  . Colon cancer screening 04/02/2020  . Rectal bleeding 04/02/2020    Past Surgical History:  Procedure Laterality Date  . ABDOMINAL HYSTERECTOMY    . CESAREAN SECTION     x2  . PORTA CATH INSERTION Right      OB History    Gravida  3   Para  3   Term  2   Preterm  1   AB      Living  3     SAB      IAB      Ectopic      Multiple      Live Births              Family History  Problem Relation Age of Onset  . Diabetes Mother   . Stroke Mother   . Stroke Father   . Colon cancer Father        x2. Diagnoased in 66s.     Social History   Tobacco Use  . Smoking status: Never Smoker  . Smokeless tobacco: Never Used  Vaping Use  . Vaping Use: Never used  Substance Use Topics  . Alcohol use: Yes    Comment: 3x/week amt varies- about 3 drinks per occasion   . Drug use: No    Home Medications Prior to Admission medications   Medication Sig Start Date End Date Taking? Authorizing Provider  Ascorbic Acid (VITAMIN C) 1000 MG tablet Take 1,000 mg by mouth daily.    [provider]  cholecalciferol (VITAMIN D3) 25 MCG (1000 UNIT) tablet Take 1,000 Units by mouth daily.    [provider]  dexamethasone (DECADRON) 4 MG tablet Take 8 mg by mouth See admin instructions. Take 8 mg on day 2,3, and 4  after chemo 04/26/20   [provider]  escitalopram (LEXAPRO) 10 MG tablet Take 10 mg by mouth daily.    [provider]  famotidine (PEPCID) 20 MG tablet Take 20 mg by mouth at bedtime.    [provider]  gabapentin (NEURONTIN) 300 MG capsule Take 300 mg by mouth 3 (three) times daily.    [provider]  linaclotide Rolan Lipa) 145 MCG CAPS capsule Take 1 capsule (145 mcg total) by mouth daily before breakfast. 04/12/20 10/09/20  [name], DO  prochlorperazine (COMPAZINE) 10 MG tablet Take 1 tablet (10 mg total) by mouth 2 (two) times daily as needed for nausea. 05/29/20   [name], MD  rizatriptan (MAXALT) 10 MG tablet Take 10 mg by mouth as needed for migraine. May repeat in 2 hours if needed    [provider]  topiramate (TOPAMAX) 50 MG tablet Take 50 mg by mouth 2 (two) times daily.    [provider]    Allergies    Ondansetron and Bactrim [sulfamethoxazole-trimethoprim]  Review of Systems   Review of Systems  Constitutional: Positive for fatigue.  Gastrointestinal: Positive for abdominal pain, diarrhea and nausea.  Musculoskeletal: Positive for myalgias.  All other systems reviewed and are negative.   Physical Exam Updated Vital Signs BP (!) 97/51   Pulse 99   Temp 98.7 F (37.1 C) (Oral)   Resp 18   Ht 5\' 9"  (1.753 m)   Wt 77.1 kg   SpO2 99%   BMI 25.10 kg/m   Physical Exam Vitals and nursing note reviewed.  Constitutional:      General: She is not in acute distress.    Appearance: Normal appearance. She is well-developed.  HENT:     Head: Normocephalic and atraumatic.     Right Ear: Hearing normal.     Left Ear: Hearing normal.     Nose: Nose normal.  Eyes:     Conjunctiva/sclera: Conjunctivae normal.     Pupils: Pupils are equal, round, and reactive to light.  Cardiovascular:     Rate and Rhythm: Regular rhythm.     Heart sounds: S1 normal and S2 normal. No murmur heard. No friction rub. No  gallop.   Pulmonary:     Effort: Pulmonary effort is normal. No respiratory distress.     Breath sounds: Normal breath sounds.  Chest:     Chest wall: No tenderness.  Abdominal:     General: Bowel sounds are normal.     Palpations: Abdomen is soft.     Tenderness: There is no abdominal tenderness. There is no guarding or rebound. Negative signs include Murphy's sign and McBurney's sign.     Hernia: No hernia is present.  Musculoskeletal:        General: Normal range of motion.     Cervical back: Normal range of motion and neck supple.  Skin:    General: Skin is warm and dry.     Findings: No rash.  Neurological:     Mental Status: She is alert and oriented to person, place, and time.     GCS: GCS eye subscore is 4. GCS verbal subscore is 5. GCS motor subscore is 6.     Cranial Nerves: No cranial nerve deficit.     Sensory: No sensory deficit.     Coordination: Coordination normal.  Psychiatric:        Speech: Speech normal.        Behavior: Behavior normal.        Thought Content: Thought content normal.     ED Results / Procedures / Treatments   Labs (all labs ordered are listed, but only abnormal results are displayed) Labs Reviewed  CBC WITH DIFFERENTIAL/PLATELET - Abnormal; Notable for the following components:      Result Value   WBC 0.3 (*)    RBC 3.15 (*)    Hemoglobin 10.3 (*)    HCT 30.6 (*)    Platelets 95 (*)    Neutro Abs 0.0 (*)    Lymphs Abs 0.3 (*)    All other components within normal limits  COMPREHENSIVE METABOLIC PANEL - Abnormal; Notable for the following components:   Sodium 131 (*)    Glucose, Bld 156 (*)    Albumin 3.4 (*)    AST 12 (*)    All other components within normal limits  URINALYSIS, ROUTINE W REFLEX MICROSCOPIC - Abnormal; Notable for the following components:   Color, Urine AMBER (*)  APPearance CLOUDY (*)    Bilirubin Urine SMALL (*)    Protein, ur 100 (*)    Bacteria, UA RARE (*)    All other components within normal  limits  LIPASE, BLOOD    EKG None ED ECG REPORT   Date: 06/26/2020  Rate: 107  Rhythm: sinus tachycardia  QRS Axis: normal  Intervals: normal  ST/T Wave abnormalities: normal  Conduction Disutrbances:none  Narrative Interpretation:   Old EKG Reviewed: unchanged  I have personally reviewed the EKG tracing and agree with the computerized printout as noted.  Radiology No results found.  Procedures Procedures   Medications Ordered in ED Medications  sodium chloride 0.9 % bolus 1,000 mL (0 mLs Intravenous Stopped 06/26/20 0526)  metoCLOPramide (REGLAN) injection 10 mg (10 mg Intravenous Given 06/26/20 0252)  sodium chloride 0.9 % bolus 1,000 mL (1,000 mLs Intravenous New Bag/Given 06/26/20 0450)    ED Course  I have reviewed the triage vital signs and the nursing notes.  Pertinent labs & imaging results that were available during my care of the patient were reviewed by me and considered in my medical decision making (see chart for details).    MDM Rules/Calculators/A&P                          Patient presents to the emergency department for evaluation of nausea and body aches.  Patient has had some diarrhea as well.  Patient currently undergoing chemotherapy for breast cancer.  She has had similar symptoms in the past after infusion.  Her last infusion was 5 days ago.  Patient hypotensive and tachycardic at arrival, consistent with dehydration.  Vital signs significantly improved with IV fluids.  Lab work reveals neutropenia and leukopenia.  This also has been seen with previous visits to the ED after infusions, consistent with chemotherapy effect.  She is not experiencing any fever.    Patient has been aggressively hydrated.  Heart rate is coming down but she is still hypotensive.  Will admit for observation for continued hydration   Final Clinical Impression(s) / ED Diagnoses Final diagnoses:  Dehydration  Chemotherapy-induced neutropenia [facility])    Rx / DC Orders ED  Discharge Orders    None       Dalaney Needle, Gwenyth Allegra, MD 06/26/20 [phone]

## 2020-06-26 NOTE — H&P (Signed)
History and Physical  Quaneisha Althouse FGH:[id number] DOB: 02-26-1971 DOA: 06/26/2020  Referring physician: Orpah Greek, MD PCP: Neale Burly, MD  Patient coming from: Home  Chief Complaint: Nausea and generalized weakness  HPI: Anna Stevenson is a 50 y.o. female with medical history significant for GERD and breast cancer on chemotherapy who presents to the emergency department due to 5-day onset of nausea, generalized weakness and 2 to 3 days of loose bowel movement (1-2 times daily).  Patient states that she has not been able to tolerate any oral intake (food and fluid intake), so she presents to the ED for further evaluation.  Last chemotherapy was on Tuesday (3/22).  She denies fever, chills, vomiting or abdominal pain. She presented to the ED due to similar symptoms on 05/29/2020, she improved after IV hydration in the ED and was discharged home.  ED Course:  In the emergency department, BP was 82/50 and other vital signs were within normal range.  Work-up in the ED showed pancytopenia, hyponatremia, hyperglycemia, hypoalbuminemia. IV hydration with 2 L of NS and IV Reglan were given.  Hospitalist was asked to admit patient for further evaluation and management.  Review of Systems: Constitutional: Negative for chills and fever.  HENT: Negative for ear pain and sore throat.   Eyes: Negative for pain and visual disturbance.  Respiratory: Negative for cough, chest tightness and shortness of breath.   Cardiovascular: Negative for chest pain and palpitations.  Gastrointestinal: Positive for nausea.  Negative for abdominal pain and vomiting.  Endocrine: Negative for polyphagia and polyuria.  Genitourinary: Negative for decreased urine volume, dysuria, enuresis Musculoskeletal: Negative for arthralgias and back pain.  Skin: Negative for color change and rash.  Allergic/Immunologic: Negative for immunocompromised state.  Neurological: Positive for weakness.  Negative for  tremors, syncope, speech difficulty Hematological: Does not bruise/bleed easily.  All other systems reviewed and are negative    Past Medical History:  Diagnosis Date  . Asthma   . Breast cancer ([place]) 01/2020   left  . GERD (gastroesophageal reflux disease)   . Migraines   . Neuropathy   . Seasonal allergies    Past Surgical History:  Procedure Laterality Date  . ABDOMINAL HYSTERECTOMY    . CESAREAN SECTION     x2  . PORTA CATH INSERTION Right     Social History:  reports that she has never smoked. She has never used smokeless tobacco. She reports current alcohol use. She reports that she does not use drugs.   Allergies  Allergen Reactions  . Ondansetron Hives and Itching  . Bactrim [Sulfamethoxazole-Trimethoprim] Hives    Family History  Problem Relation Age of Onset  . Diabetes Mother   . Stroke Mother   . Stroke Father   . Colon cancer Father        x2. Diagnoased in 91s.     Prior to Admission medications   Medication Sig Start Date End Date Taking? Authorizing Provider  Ascorbic Acid (VITAMIN C) 1000 MG tablet Take 1,000 mg by mouth daily.    [provider]  cholecalciferol (VITAMIN D3) 25 MCG (1000 UNIT) tablet Take 1,000 Units by mouth daily.    [provider]  dexamethasone (DECADRON) 4 MG tablet Take 8 mg by mouth See admin instructions. Take 8 mg on day 2,3, and 4 after chemo 04/26/20   [provider]  escitalopram (LEXAPRO) 10 MG tablet Take 10 mg by mouth daily.    [provider]  famotidine (PEPCID) 20 MG tablet  Take 20 mg by mouth at bedtime.    [provider]  gabapentin (NEURONTIN) 300 MG capsule Take 300 mg by mouth 3 (three) times daily.    [provider]  linaclotide Rolan Lipa) 145 MCG CAPS capsule Take 1 capsule (145 mcg total) by mouth daily before breakfast. 04/12/20 10/09/20  [name], DO  prochlorperazine (COMPAZINE) 10 MG tablet Take 1 tablet (10 mg total) by mouth 2 (two) times  daily as needed for nausea. 05/29/20   [name], MD  rizatriptan (MAXALT) 10 MG tablet Take 10 mg by mouth as needed for migraine. May repeat in 2 hours if needed    [provider]  topiramate (TOPAMAX) 50 MG tablet Take 50 mg by mouth 2 (two) times daily.    [provider]    Physical Exam: BP 92/61   Pulse 95   Temp 98.7 F (37.1 C) (Oral)   Resp 15   Ht 5\' 9"  (1.753 m)   Wt 77.1 kg   SpO2 99%   BMI 25.10 kg/m   . General: 50 y.o. year-old female well developed well nourished in no acute distress.  Alert and oriented x3. Marland Kitchen HEENT: Dry mucous membrane.  NCAT, EOMI . Neck: Supple, trachea medial . Cardiovascular: Regular rate and rhythm with no rubs or gallops.  No thyromegaly or JVD noted.  No lower extremity edema. 2/4 pulses in all 4 extremities. Marland Kitchen Respiratory: Clear to auscultation with no wheezes or rales. Good inspiratory effort. . Abdomen: Soft nontender nondistended with normal bowel sounds x4 quadrants. . Muskuloskeletal: No cyanosis, clubbing or edema noted bilaterally . Neuro: CN II-XII intact, sensation, reflexes intact, no focal neurologic deficit . Skin: No ulcerative lesions noted or rashes . Psychiatry: Judgement and insight appear normal. Mood is appropriate for condition and setting          Labs on Admission:  Basic Metabolic Panel: Recent Labs  Lab 06/26/20 0253  NA 131*  K 4.0  CL 99  CO2 23  GLUCOSE 156*  BUN 15  CREATININE 0.86  CALCIUM 8.9   Liver Function Tests: Recent Labs  Lab 06/26/20 0253  AST 12*  ALT 24  ALKPHOS 46  BILITOT 0.8  PROT 6.8  ALBUMIN 3.4*   Recent Labs  Lab 06/26/20 0253  LIPASE 26   No results for input(s): AMMONIA in the last 168 hours. CBC: Recent Labs  Lab 06/26/20 0253  WBC 0.3*  NEUTROABS 0.0*  HGB 10.3*  HCT 30.6*  MCV 97.1  PLT 95*   Cardiac Enzymes: No results for input(s): CKTOTAL, CKMB, CKMBINDEX, TROPONINI in the last 168 hours.  BNP (last 3 results) No results  for input(s): BNP in the last 8760 hours.  ProBNP (last 3 results) No results for input(s): PROBNP in the last 8760 hours.  CBG: No results for input(s): GLUCAP in the last 168 hours.  Radiological Exams on Admission: No results found.  EKG: I independently viewed the EKG done and my findings are as followed: EKG was not done in the ED  Assessment/Plan Present on Admission: . Hypotension . Breast cancer ([place])  Principal Problem:   Hypotension Active Problems:   Breast cancer (HCC)   Pancytopenia (HCC)   Hyponatremia   Dehydration   Hyperglycemia   Hypoalbuminemia due to protein-calorie malnutrition (HCC)  Hypotension This is possibly due to patient's several days of poor oral intake Continue IV hydration  Nausea and generalized weakness possibly secondary to chemotherapy Continue IV Compazine 10 mg every  6 hours as needed  Pancytopenia possibly secondary to chemotherapy WBC 0.3 (this was 0.6 on 05/29/2020) H/H 10.3/30.6 (this was 11.3/33.0 on 05/29/2020) Platelets 95 (this was 120 on 05/29/2020) Continue to monitor CBC with morning labs  Hyponatremia possibly secondary to dehydration Na 131, continue IV hydration  Dehydration Continue IV hydration  Hyperglycemia possibly reactive CBG 156, patient had some ginger ale in the ED Continue to monitor blood glucose level with morning labs  Hypoalbuminemia possibly due to mild protein calorie malnutrition Albumin 3.4, protein supplement will be provided  GERD Continue IV Protonix   DVT prophylaxis: SCDs  Code Status: Full code  Family Communication: None at bedside  Disposition Plan:  Patient is from:                        home Anticipated DC to:                   home Anticipated DC date:               1 day Anticipated DC barriers:         patient is unstable to be discharged at this time due to low blood pressure requiring IV hydration and nausea with inability to tolerate oral intake   Consults called:  None  Admission status: Observation    Bernadette Hoit MD [facility]  06/26/2020, 6:40 AM

## 2020-06-26 NOTE — ED Notes (Signed)
Gave pt ginger ale.  

## 2020-06-26 NOTE — Progress Notes (Signed)
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  She states that her abdominal pain was primarily periumbilical and suprapubic, but states that this has improved since arrival to the emergency department.  She states that she had a similar episode after her last cycle of chemotherapy about 2 weeks ago, but it was not this severe.  She states that she is having at least 3-4 loose bowel movements daily.  She denied any fevers, chills, emesis, hematochezia, melena, dysuria, hematuria.  She denies any other new medications.  Her last chemotherapy infusion was on 06/20/2020.  She is complaining of some myalgias but denies any frank synovitis.  She denies any chest pain, shortness of breath, coughing, hemoptysis. In the emergency department, the patient was afebrile, but she had tachycardia and hypotension with systolic blood pressure in the 80s and heart rate ~110.  Patient was given 2 L of normal saline with some improvement for blood pressure.  WBC was noted to be 0.3 with hemoglobin 10.3, platelets 95,000 BMP showed a sodium 131, sodium one 4.0, serum creatinine 0.6.  Because of her leukopenia and continued soft blood pressures, the patient was admitted for further evaluation and treatment  Assessment/Plan: Hypotension -Likely due to volume depletion -Check PCT -Lactic acid -UA and urine culture -Blood cultures x2 sets -Continue IV fluids -A.m. cortisol  Diarrhea -Check C. Difficile -Stool pathogen panel -Likely related to recent chemotherapy -Patient had a positive H. pylori stool antigen on 06/19/2020 from outside facility--> will ultimately need outpatient treatment -She has been seen by [facility]  and they are planning for EGD and colonoscopy in the near future  Leukopenia -Start Granix -A.m. CBC  Hyponatremia -Secondary to volume depletion -Continue IV fluids  Pancytopenia -Due to the patient's recent chemotherapy -I37 -Folic acid  Depression/anxiety -Continue Lexapro  GERD -Continue Protonix  Hyperglycemia -Check A1c      Status is: Observation  The patient remains OBS appropriate and will d/c before 2 midnights.  Dispo: The patient is from: Home              Anticipated d/c is to: Home              Patient currently is not medically stable to d/c.   Difficult to place patient No        Family Communication:  no Family at bedside  Consultants:  none  Code Status:  FULL   DVT Prophylaxis:  SCDs   Procedures: As Listed in Progress Note Above  Antibiotics: None    Total time spent 35 minutes.  Greater than 50% spent face to face counseling and coordinating care.    Subjective: Patient states abd pain is better.  Continues to have generalized weakness.  Denies f,c cp, vomiting, hematochezia, melena.  Objective: Vitals:   06/26/20 0600 06/26/20 0610 06/26/20 0611 06/26/20 0734  BP: 102/64 92/61  104/67  Pulse: 94 95  93  Resp: 17  15 16   Temp:      TempSrc:      SpO2: 100% 99%  100%  Weight:      Height:       No intake or output data in the 24 hours ending 06/26/20 0744 Weight change:  Exam:  General:  Pt is alert, follows commands appropriately, not in acute distress  HEENT: No icterus, No thrush, No neck mass, [place]/AT  Cardiovascular: RRR, S1/S2, no rubs, no gallops  Respiratory: CTA bilaterally, no wheezing, no crackles, no rhonchi  Abdomen: Soft/+BS, non tender, non distended, no guarding  Extremities: No edema, No lymphangitis, No petechiae, No rashes, no synovitis   Data Reviewed: I have personally reviewed following labs and imaging studies Basic Metabolic Panel: Recent Labs  Lab 06/26/20 0253  NA 131*   K 4.0  CL 99  CO2 23  GLUCOSE 156*  BUN 15  CREATININE 0.86  CALCIUM 8.9   Liver Function Tests: Recent Labs  Lab 06/26/20 0253  AST 12*  ALT 24  ALKPHOS 46  BILITOT 0.8  PROT 6.8  ALBUMIN 3.4*   Recent Labs  Lab 06/26/20 0253  LIPASE 26   No results for input(s): AMMONIA in the last 168 hours. Coagulation Profile: No results for input(s): INR, PROTIME in the last 168 hours. CBC: Recent Labs  Lab 06/26/20 0253  WBC 0.3*  NEUTROABS 0.0*  HGB 10.3*  HCT 30.6*  MCV 97.1  PLT 95*   Cardiac Enzymes: No results for input(s): CKTOTAL, CKMB, CKMBINDEX, TROPONINI in the last 168 hours. BNP: Invalid input(s): POCBNP CBG: No results for input(s): GLUCAP in the last 168 hours. HbA1C: No results for input(s): HGBA1C in the last 72 hours. Urine analysis:    Component Value Date/Time   COLORURINE AMBER (A) 06/26/2020 0244   APPEARANCEUR CLOUDY (A) 06/26/2020 0244   LABSPEC 1.019 06/26/2020 0244   PHURINE 7.0 06/26/2020 0244   GLUCOSEU NEGATIVE 06/26/2020 0244   HGBUR NEGATIVE 06/26/2020 0244   BILIRUBINUR SMALL (A) 06/26/2020 0244   KETONESUR NEGATIVE 06/26/2020 0244   PROTEINUR 100 (A) 06/26/2020 0244   NITRITE NEGATIVE 06/26/2020 0244   LEUKOCYTESUR NEGATIVE 06/26/2020 0244   Sepsis Labs: @LABRCNTIP (procalcitonin:4,lacticidven:4) ) Recent Results (from the past 240 hour(s))  Resp Panel by RT-PCR (Flu A&B, Covid) Nasopharyngeal Swab     Status: None   Collection Time: 06/26/20  6:00 AM   Specimen: Nasopharyngeal Swab; Nasopharyngeal(NP) swabs in vial transport medium  Result Value Ref Range Status   SARS Coronavirus 2 by RT PCR NEGATIVE NEGATIVE Final    Comment: (NOTE) SARS-CoV-2 target nucleic acids are NOT DETECTED.  The SARS-CoV-2 RNA is generally detectable in upper respiratory specimens during the acute phase of infection. The lowest concentration of SARS-CoV-2 viral copies this assay can detect is 138 copies/mL. A negative result does not  preclude SARS-Cov-2 infection and should not be used as the sole basis for treatment or other patient management decisions. A negative result may occur with  improper specimen collection/handling, submission of specimen other than nasopharyngeal swab, presence of viral mutation(s) within the areas targeted by this assay, and inadequate number of viral copies(<138 copies/mL). A negative result must be combined with clinical observations, patient history, and epidemiological information. The expected result is Negative.  Fact Sheet for Patients:  EntrepreneurPulse.com.au  Fact Sheet for Healthcare Providers:  IncredibleEmployment.be  This test is no t yet approved or cleared by the [place] FDA and  has been authorized for detection and/or diagnosis of SARS-CoV-2 by FDA under an Emergency Use Authorization (EUA). This EUA will remain  in effect (meaning this test can be used) for the duration of the COVID-19 declaration under Section 564(b)(1) of the Act, 21 U.S.C.section 360bbb-3(b)(1), unless the authorization is terminated  or revoked sooner.       Influenza  A by PCR NEGATIVE NEGATIVE Final   Influenza B by PCR NEGATIVE NEGATIVE Final    Comment: (NOTE) The Xpert Xpress SARS-CoV-2/FLU/RSV plus assay is intended as an aid in the diagnosis of influenza from Nasopharyngeal swab specimens and should not be used as a sole basis for treatment. Nasal washings and aspirates are unacceptable for Xpert Xpress SARS-CoV-2/FLU/RSV testing.  Fact Sheet for Patients: EntrepreneurPulse.com.au  Fact Sheet for Healthcare Providers: IncredibleEmployment.be  This test is not yet approved or cleared by the [place] FDA and has been authorized for detection and/or diagnosis of SARS-CoV-2 by FDA under an Emergency Use Authorization (EUA). This EUA will remain in effect (meaning this test can be used) for the  duration of the COVID-19 declaration under Section 564(b)(1) of the Act, 21 U.S.C. section 360bbb-3(b)(1), unless the authorization is terminated or revoked.  Performed at [facility], [address], [place],  [address]      Scheduled Meds: . feeding supplement  237 mL Oral BID BM  . pantoprazole (PROTONIX) IV  40 mg Intravenous Q24H   Continuous Infusions:  Procedures/Studies: DG Chest Port 1 View  Result Date: 05/29/2020 CLINICAL DATA:  Shortness of breath and cough EXAM: PORTABLE CHEST 1 VIEW COMPARISON:  03/29/2020 FINDINGS: Porta catheter on the right with tip at the SVC. Normal heart size and mediastinal contours. There is no edema, consolidation, effusion, or pneumothorax. Irregularity of the lateral right third rib with suspected regional callus. IMPRESSION: 1. Clear lungs. 2. Irregular appearance of the lateral right third rib, favor subacute fracture. No lesion was seen in this area on a staging CT December 2021. Electronically Signed   By: Monte Fantasia M.D.   On: 05/29/2020 05:10    Orson Eva, DO  [facility]  If 7PM-7AM, please contact night-coverage [email or web address] Password [email or web address] 06/26/2020, 7:44 AM   LOS: 0 days

## 2020-06-26 NOTE — ED Triage Notes (Signed)
Pt c/o nausea, poor po intake, diarrhea, and body aches.

## 2020-06-26 NOTE — Progress Notes (Signed)
Notified by RN of temp of 100.8 -patient now has febrile neutropenia -start empiric vanc and cefepime -continue IVF -follow cultures -Granix started  DTat

## 2020-06-26 NOTE — Progress Notes (Addendum)
Pharmacy Antibiotic Note  Anna Stevenson is a 50 y.o. female admitted on 06/26/2020 with 5-day onset of nausea, generalized weakness and 2 to 3 days of loose bowel movement (1-2 times daily),  BP 82/50, and pancytopenia. Pharmacy has been consulted for vancomycin dosing for febrile neutropenia.  Plan: Vancomycin 1750 mg IV load followed by 1000 mg IV q 12 hr. Calc AUC: 535.1 mcg*h/ml Will follow levels, renal function, clinical status, and cultures.   Height: 5\' 9"  (175.3 cm) Weight: 77.1 kg (170 lb) IBW/kg (Calculated) : 66.2  Temp (24hrs), Avg:99.6 F (37.6 C), Min:98.7 F (37.1 C), Max:100.8 F (38.2 C)  Recent Labs  Lab 06/26/20 0253 06/26/20 0822  WBC 0.3*  --   CREATININE 0.86  --   LATICACIDVEN  --  1.4    Estimated Creatinine Clearance: 84.5 mL/min (by C-G formula based on SCr of 0.86 mg/dL).    Allergies  Allergen Reactions  . Ondansetron Hives and Itching  . Bactrim [Sulfamethoxazole-Trimethoprim] Hives    Antimicrobials this admission: Cefepime 3/29 >>  Vancomycin 3/29 >>    Microbiology results:  BCx: pending  UCx: pending   Thank you for allowing pharmacy to be a part of this patient's care.  Blenda Nicely 06/26/2020 7:28 PM

## 2020-06-27 DIAGNOSIS — R5081 Fever presenting with conditions classified elsewhere: Secondary | ICD-10-CM

## 2020-06-27 DIAGNOSIS — K219 Gastro-esophageal reflux disease without esophagitis: Secondary | ICD-10-CM

## 2020-06-27 DIAGNOSIS — C50912 Malignant neoplasm of unspecified site of left female breast: Secondary | ICD-10-CM

## 2020-06-27 DIAGNOSIS — D709 Neutropenia, unspecified: Secondary | ICD-10-CM

## 2020-06-27 DIAGNOSIS — R110 Nausea: Secondary | ICD-10-CM

## 2020-06-27 LAB — COMPREHENSIVE METABOLIC PANEL
ALT: 17 U/L (ref 0–44)
AST: 9 U/L — ABNORMAL LOW (ref 15–41)
Albumin: 2.8 g/dL — ABNORMAL LOW (ref 3.5–5.0)
Alkaline Phosphatase: 38 U/L (ref 38–126)
Anion gap: 7 (ref 5–15)
BUN: 7 mg/dL (ref 6–20)
CO2: 23 mmol/L (ref 22–32)
Calcium: 8.5 mg/dL — ABNORMAL LOW (ref 8.9–10.3)
Chloride: 104 mmol/L (ref 98–111)
Creatinine, Ser: 0.55 mg/dL (ref 0.44–1.00)
GFR, Estimated: >60 mL/min (ref >60)
Glucose, Bld: 99 mg/dL (ref 70–99)
Potassium: 3.8 mmol/L (ref 3.5–5.1)
Sodium: 134 mmol/L — ABNORMAL LOW (ref 135–145)
Total Bilirubin: 0.5 mg/dL (ref 0.3–1.2)
Total Protein: 5.6 g/dL — ABNORMAL LOW (ref 6.5–8.1)

## 2020-06-27 LAB — GASTROINTESTINAL PANEL BY PCR, STOOL (REPLACES STOOL CULTURE)

## 2020-06-27 LAB — CBC WITH DIFFERENTIAL/PLATELET
Band Neutrophils: 2 %
Basophils Absolute: 0 10*3/uL (ref 0.0–0.1)
Basophils Relative: 1 %
Eosinophils Absolute: 0 10*3/uL (ref 0.0–0.5)
Eosinophils Relative: 0 %
HCT: 24.7 % — ABNORMAL LOW (ref 36.0–46.0)
Hemoglobin: 8.3 g/dL — ABNORMAL LOW (ref 12.0–15.0)
Lymphocytes Relative: 27 %
Lymphs Abs: 0.3 10*3/uL — ABNORMAL LOW (ref 0.7–4.0)
MCH: 33.3 pg (ref 26.0–34.0)
MCHC: 33.6 g/dL (ref 30.0–36.0)
MCV: 99.2 fL (ref 80.0–100.0)
Metamyelocytes Relative: 6 %
Monocytes Absolute: 0.5 10*3/uL (ref 0.1–1.0)
Monocytes Relative: 40 %
Myelocytes: 2 %
Neutro Abs: 0.3 10*3/uL — CL (ref 1.7–7.7)
Neutrophils Relative %: 22 %
Platelets: 69 10*3/uL — ABNORMAL LOW (ref 150–400)
RBC: 2.49 MIL/uL — ABNORMAL LOW (ref 3.87–5.11)
RDW: 14.3 % (ref 11.5–15.5)
WBC: 1.2 10*3/uL — CL (ref 4.0–10.5)
nRBC: 0 % (ref 0.0–0.2)

## 2020-06-27 LAB — PROTIME-INR
INR: 1.1 (ref 0.8–1.2)
Prothrombin Time: 14.1 seconds (ref 11.4–15.2)

## 2020-06-27 LAB — URINE CULTURE: Culture: NO GROWTH

## 2020-06-27 LAB — APTT: aPTT: 31 seconds (ref 24–36)

## 2020-06-27 LAB — MAGNESIUM: Magnesium: 1.8 mg/dL (ref 1.7–2.4)

## 2020-06-27 LAB — PHOSPHORUS: Phosphorus: 2.4 mg/dL — ABNORMAL LOW (ref 2.5–4.6)

## 2020-06-27 LAB — CORTISOL-AM, BLOOD: Cortisol - AM: 10.9 ug/dL (ref 6.7–22.6)

## 2020-06-27 MED ORDER — TBO-FILGRASTIM 300 MCG/0.5ML ~~LOC~~ SOSY
300.0000 ug | PREFILLED_SYRINGE | Freq: Every day | SUBCUTANEOUS | Status: DC
  Administered 2020-06-27: 300 ug via SUBCUTANEOUS
  Filled 2020-06-27 (×2): qty 0.5

## 2020-06-27 NOTE — Progress Notes (Signed)
Patient able to tolerate soft diet thus far in shift. No c/o Nausea or any loose stools

## 2020-06-27 NOTE — Progress Notes (Signed)
PROGRESS NOTE    Anna Stevenson  WUJ:[id number] DOB: October 25, 1970 DOA: 06/26/2020 PCP: Neale Burly, MD   Chief Complaint  Patient presents with  . Nausea    Brief admission narrative:  As per H&P written by Dr. 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  She states that her abdominal pain was primarily periumbilical and suprapubic, but states that this has improved since arrival to the emergency department.  She states that she had a similar episode after her last cycle of chemotherapy about 2 weeks ago, but it was not this severe.  She states that she is having at least 3-4 loose bowel movements daily.  She denied any fevers, chills, emesis, hematochezia, melena, dysuria, hematuria.  She denies any other new medications.  Her last chemotherapy infusion was on 06/20/2020.  She is complaining of some myalgias but denies any frank synovitis.  She denies any chest pain, shortness of breath, coughing, hemoptysis. In the emergency department, the patient was afebrile, but she had tachycardia and hypotension with systolic blood pressure in the 80s and heart rate ~110.  Patient was given 2 L of normal saline with some improvement for blood pressure.  WBC was noted to be 0.3 with hemoglobin 10.3, platelets 95,000 BMP showed a sodium 131, sodium one 4.0, serum creatinine 0.6.  Because of her leukopenia and continued soft blood pressures, the patient was admitted for further evaluation and treatment Assessment & Plan: 1-neutropenic fever -Continue current IV antibiotics -Patient has received Granix on 06/26/2020; will follow neutrophil counts. -Follow culture results -Continue as needed antipyretics and supportive care. -So far blood culture without growth; C. difficile and GI panel negative.  2-hypertension -In  the setting of volume depletion -Continue IV fluids  3-Breast cancer ([place]) -Actively receiving chemotherapy -Continue patient follow-up with oncology service.  4-Pancytopenia ([place]) -In the setting of chemotherapy and malignancy -Continue to follow cell count trend -No signs of spontaneous bleeding. -Hemoglobin close to her baseline. -Leukopenia/neutropenia as mentioned above.  5-gastroesophageal reflux disease -Continue PPI.  6-nausea/vomiting -In the setting of chemotherapy most likely -Continue as needed antiemetics -Will advance diet and assess for tolerance -Follow clinical response.  7-hyponatremia/dehydration -Continue IV fluid -Advancing diet -Follow response.  8-depression/anxiety -Continue Lexapro -Mood overall stable -No suicidal ideation or hallucinations.   DVT prophylaxis:  Code Status: Full code Family Communication: No family at bedside. Disposition:   Status is: Inpatient  Dispo: The patient is from: Home              Anticipated d/c is to: Home              Patient currently no medically stable for discharge; currently in the process of advancing diet, following cultures and waiting resolution of her neutropenia.  Continue IV antibiotics.   Difficult to place patient no       Consultants:   None   Procedures:  See below for x-ray reports   Antimicrobials:  Vancomycin and cefepime   Subjective: Feeling better today; low-grade temperature appreciated overnight.  Currently afebrile, no nausea, no vomiting.  Denies any chest pain.  Patient would like diet advance and is asking to take a shower.  Objective: Vitals:   06/26/20 1358 06/26/20 1800 06/26/20 2030 06/27/20 0459  BP: 108/62 (!) 101/52 112/64 (!) 92/56  Pulse: 93 98  87  Resp:  20 20 20  Temp: 99.5 F (37.5 C) (!) 100.8 F (38.2 C) 98.3 F (36.8 C) 98 F (36.7 C)  TempSrc: Oral Oral Oral   SpO2: 100% 100% 100% 99%  Weight:      Height:        Intake/Output Summary  (Last 24 hours) at 06/27/2020 1534 Last data filed at 06/27/2020 1500 Gross per 24 hour  Intake 953.93 ml  Output --  Net 953.93 ml   Filed Weights   06/26/20 0237  Weight: 77.1 kg    Examination:  General exam: Appears calm and comfortable, no chest pain, no nausea, no vomiting.  Currently afebrile.  Will like to have diet advanced. Respiratory system: Clear to auscultation. Respiratory effort normal.  No requiring oxygen supplementation. Cardiovascular system: S1 & S2 heard, RRR. No JVD, murmurs, rubs, gallops or clicks. No pedal edema. Gastrointestinal system: Abdomen is nondistended, soft and nontender. No organomegaly or masses felt. Normal bowel sounds heard. Central nervous system: Alert and oriented. No focal neurological deficits. Extremities: No cyanosis or clubbing. Skin: No rashes, no petechiae. Psychiatry: Judgement and insight appear normal. Mood & affect appropriate.     Data Reviewed: I have personally reviewed following labs and imaging studies  CBC: Recent Labs  Lab 06/26/20 0253 06/27/20 0430  WBC 0.3* 1.2*  NEUTROABS 0.0* 0.3*  HGB 10.3* 8.3*  HCT 30.6* 24.7*  MCV 97.1 99.2  PLT 95* 69*    Basic Metabolic Panel: Recent Labs  Lab 06/26/20 0253 06/27/20 0430  NA 131* 134*  K 4.0 3.8  CL 99 104  CO2 23 23  GLUCOSE 156* 99  BUN 15 7  CREATININE 0.86 0.55  CALCIUM 8.9 8.5*  MG  --  1.8  PHOS  --  2.4*    GFR: Estimated Creatinine Clearance: 90.9 mL/min (by C-G formula based on SCr of 0.55 mg/dL).  Liver Function Tests: Recent Labs  Lab 06/26/20 0253 06/27/20 0430  AST 12* 9*  ALT 24 17  ALKPHOS 46 38  BILITOT 0.8 0.5  PROT 6.8 5.6*  ALBUMIN 3.4* 2.8*    CBG: No results for input(s): GLUCAP in the last 168 hours.   Recent Results (from the past 240 hour(s))  Resp Panel by RT-PCR (Flu A&B, Covid) Nasopharyngeal Swab     Status: None   Collection Time: 06/26/20  6:00 AM   Specimen: Nasopharyngeal Swab; Nasopharyngeal(NP) swabs  in vial transport medium  Result Value Ref Range Status   SARS Coronavirus 2 by RT PCR NEGATIVE NEGATIVE Final    Comment: (NOTE) SARS-CoV-2 target nucleic acids are NOT DETECTED.  The SARS-CoV-2 RNA is generally detectable in upper respiratory specimens during the acute phase of infection. The lowest concentration of SARS-CoV-2 viral copies this assay can detect is 138 copies/mL. A negative result does not preclude SARS-Cov-2 infection and should not be used as the sole basis for treatment or other patient management decisions. A negative result may occur with  improper specimen collection/handling, submission of specimen other than nasopharyngeal swab, presence of viral mutation(s) within the areas targeted by this assay, and inadequate number of viral copies(<138 copies/mL). A negative result must be combined with clinical observations, patient history, and epidemiological information. The expected result is Negative.  Fact Sheet for Patients:  EntrepreneurPulse.com.au  Fact Sheet for Healthcare Providers:  IncredibleEmployment.be  This test is no t yet approved or cleared by the [place] FDA and  has been authorized for detection and/or diagnosis of SARS-CoV-2 by FDA under an Emergency Use Authorization (EUA). This  EUA will remain  in effect (meaning this test can be used) for the duration of the COVID-19 declaration under Section 564(b)(1) of the Act, 21 U.S.C.section 360bbb-3(b)(1), unless the authorization is terminated  or revoked sooner.       Influenza A by PCR NEGATIVE NEGATIVE Final   Influenza B by PCR NEGATIVE NEGATIVE Final    Comment: (NOTE) The Xpert Xpress SARS-CoV-2/FLU/RSV plus assay is intended as an aid in the diagnosis of influenza from Nasopharyngeal swab specimens and should not be used as a sole basis for treatment. Nasal washings and aspirates are unacceptable for Xpert Xpress  SARS-CoV-2/FLU/RSV testing.  Fact Sheet for Patients: EntrepreneurPulse.com.au  Fact Sheet for Healthcare Providers: IncredibleEmployment.be  This test is not yet approved or cleared by the [place] FDA and has been authorized for detection and/or diagnosis of SARS-CoV-2 by FDA under an Emergency Use Authorization (EUA). This EUA will remain in effect (meaning this test can be used) for the duration of the COVID-19 declaration under Section 564(b)(1) of the Act, 21 U.S.C. section 360bbb-3(b)(1), unless the authorization is terminated or revoked.  Performed at [facility], [address] [place], [place] [address]   Culture, blood (Routine X 2) w Reflex to ID Panel     Status: None (Preliminary result)   Collection Time: 06/26/20  8:21 AM   Specimen: BLOOD RIGHT HAND  Result Value Ref Range Status   Specimen Description BLOOD RIGHT HAND  Final   Special Requests   Final    BOTTLES DRAWN AEROBIC AND ANAEROBIC Blood Culture adequate volume   Culture   Final    NO GROWTH < 24 HOURS Performed at [facility], [address] [place] [address] [place], [place] [address]    Report Status PENDING  Incomplete  Culture, blood (Routine X 2) w Reflex to ID Panel     Status: None (Preliminary result)   Collection Time: 06/26/20  8:22 AM   Specimen: BLOOD  Result Value Ref Range Status   Specimen Description BLOOD LEFT ANTECUBITAL  Final   Special Requests   Final    Blood Culture adequate volume BOTTLES DRAWN AEROBIC AND ANAEROBIC   Culture   Final    NO GROWTH < 24 HOURS Performed at [facility], [address], [place], [place] [address]    Report Status PENDING  Incomplete  Culture, Urine     Status: None   Collection Time: 06/26/20 10:15 AM   Specimen: Urine, Clean Catch  Result Value Ref Range Status   Specimen Description   Final    URINE, CLEAN CATCH Performed at [facility], [address], [place], [place] [address]    Special Requests   Final     NONE Performed at [facility], [address] [place], [place] [address]    Culture   Final    NO GROWTH Performed at [facility], [place] [address], [place], [place] [address]    Report Status 06/27/2020 FINAL  Final  Gastrointestinal Panel by PCR , Stool     Status: None   Collection Time: 06/26/20  5:11 PM   Specimen: Urine, Random; Stool  Result Value Ref Range Status   Campylobacter species NOT DETECTED NOT DETECTED Final   Plesimonas shigelloides NOT DETECTED NOT DETECTED Final   Salmonella species NOT DETECTED NOT DETECTED Final   Yersinia enterocolitica NOT DETECTED NOT DETECTED Final   Vibrio species NOT DETECTED NOT DETECTED Final   Vibrio cholerae NOT DETECTED NOT DETECTED Final   Enteroaggregative E  coli (EAEC) NOT DETECTED NOT DETECTED Final   Enteropathogenic E coli (EPEC) NOT DETECTED NOT DETECTED Final   Enterotoxigenic E coli (ETEC) NOT DETECTED NOT DETECTED Final   Shiga like toxin producing E coli (STEC) NOT DETECTED NOT DETECTED Final   Shigella/Enteroinvasive E coli (EIEC) NOT DETECTED NOT DETECTED Final   Cryptosporidium NOT DETECTED NOT DETECTED Final   Cyclospora cayetanensis NOT DETECTED NOT DETECTED Final   Entamoeba histolytica NOT DETECTED NOT DETECTED Final   Giardia lamblia NOT DETECTED NOT DETECTED Final   Adenovirus F40/41 NOT DETECTED NOT DETECTED Final   Astrovirus NOT DETECTED NOT DETECTED Final   Norovirus GI/GII NOT DETECTED NOT DETECTED Final   Rotavirus A NOT DETECTED NOT DETECTED Final   Sapovirus (I, II, IV, and V) NOT DETECTED NOT DETECTED Final    Comment: Performed at [facility], [address], [place], [place] [address]     Radiology Studies: DG CHEST PORT 1 VIEW  Result Date: 06/26/2020 CLINICAL DATA:  Febrile neutropenia. EXAM: PORTABLE CHEST 1 VIEW COMPARISON:  Radiograph 05/29/2020.  CT 03/19/2020 FINDINGS: Right chest port remains in place.The cardiomediastinal contours are normal. The lungs are clear. Pulmonary  vasculature is normal. No consolidation, pleural effusion, or pneumothorax. Irregularity of the right third rib on prior exam is not seen currently. No acute osseous abnormalities are seen. IMPRESSION: No acute chest findings. Electronically Signed   By: Keith Rake M.D.   On: 06/26/2020 19:39    Scheduled Meds: . Chlorhexidine Gluconate Cloth  6 each Topical Daily  . citalopram  20 mg Oral Daily  . feeding supplement  237 mL Oral BID BM  . pantoprazole (PROTONIX) IV  40 mg Intravenous Q24H  . Tbo-filgastrim (GRANIX) SQ  300 mcg Subcutaneous q1800   Continuous Infusions: . sodium chloride 100 mL/hr at 06/27/20 0856  . ceFEPime (MAXIPIME) IV 2 g (06/27/20 1441)  . vancomycin 1,000 mg (06/27/20 0853)     LOS: 1 day    Time spent: 35 minutes.    Barton Dubois, MD [facility]   To contact the attending provider between 7A-7P or the covering provider during after hours 7P-7A, please log into the web site [email or web address] and access using universal [place] password for that web site. If you do not have the password, please call the hospital operator.  06/27/2020, 3:34 PM

## 2020-06-27 NOTE — Plan of Care (Signed)

## 2020-06-28 DIAGNOSIS — D709 Neutropenia, unspecified: Secondary | ICD-10-CM

## 2020-06-28 DIAGNOSIS — R5081 Fever presenting with conditions classified elsewhere: Secondary | ICD-10-CM

## 2020-06-28 LAB — CBC WITH DIFFERENTIAL/PLATELET
Abs Immature Granulocytes: 1.19 10*3/uL — ABNORMAL HIGH (ref 0.00–0.07)
Band Neutrophils: 33 %
Basophils Absolute: 0 10*3/uL (ref 0.0–0.1)
Basophils Relative: 0 %
Blasts: 0 %
Eosinophils Absolute: 0 10*3/uL (ref 0.0–0.5)
Eosinophils Relative: 0 %
HCT: 25.8 % — ABNORMAL LOW (ref 36.0–46.0)
Hemoglobin: 8.4 g/dL — ABNORMAL LOW (ref 12.0–15.0)
Lymphocytes Relative: 10 %
Lymphs Abs: 1.1 10*3/uL (ref 0.7–4.0)
MCH: 32.2 pg (ref 26.0–34.0)
MCHC: 32.6 g/dL (ref 30.0–36.0)
MCV: 98.9 fL (ref 80.0–100.0)
Metamyelocytes Relative: 4 %
Monocytes Absolute: 1.7 10*3/uL — ABNORMAL HIGH (ref 0.1–1.0)
Monocytes Relative: 16 %
Myelocytes: 3 %
Neutro Abs: 6.8 10*3/uL (ref 1.7–7.7)
Neutrophils Relative %: 30 %
Other: 0 %
Platelets: 76 10*3/uL — ABNORMAL LOW (ref 150–400)
Promyelocytes Relative: 4 %
RBC: 2.61 MIL/uL — ABNORMAL LOW (ref 3.87–5.11)
RDW: 14.3 % (ref 11.5–15.5)
WBC: 10.8 10*3/uL — ABNORMAL HIGH (ref 4.0–10.5)
nRBC: 0 /100 WBC
nRBC: 0.8 % — ABNORMAL HIGH (ref 0.0–0.2)

## 2020-06-28 LAB — BASIC METABOLIC PANEL
Anion gap: 10 (ref 5–15)
BUN: 6 mg/dL (ref 6–20)
CO2: 25 mmol/L (ref 22–32)
Calcium: 8.6 mg/dL — ABNORMAL LOW (ref 8.9–10.3)
Chloride: 102 mmol/L (ref 98–111)
Creatinine, Ser: 0.64 mg/dL (ref 0.44–1.00)
GFR, Estimated: >60 mL/min (ref >60)
Glucose, Bld: 100 mg/dL — ABNORMAL HIGH (ref 70–99)
Potassium: 3.5 mmol/L (ref 3.5–5.1)
Sodium: 137 mmol/L (ref 135–145)

## 2020-06-28 MED ORDER — HEPARIN SOD (PORK) LOCK FLUSH 100 UNIT/ML IV SOLN
500.0000 [IU] | Freq: Once | INTRAVENOUS | Status: AC
  Administered 2020-06-28: 500 [IU] via INTRAVENOUS
  Filled 2020-06-28: qty 5

## 2020-06-28 MED ORDER — AMOXICILLIN-POT CLAVULANATE 875-125 MG PO TABS: 1.0000 | ORAL_TABLET | Freq: Two times a day (BID) | ORAL | 0 refills | Status: AC

## 2020-06-28 MED ORDER — PANTOPRAZOLE SODIUM 40 MG PO TBEC: 40.0000 mg | DELAYED_RELEASE_TABLET | Freq: Every day | ORAL | 1 refills | Status: DC

## 2020-06-28 NOTE — Discharge Summary (Signed)
Physician Discharge Summary  Anna Stevenson HCW:[id number] DOB: 07-Sep-1970 DOA: 06/26/2020  PCP: Neale Burly, MD  Admit date: 06/26/2020 Discharge date: 06/28/2020  Time spent: 35 minutes  Recommendations for Outpatient Follow-up:  1. Repeat CBC to follow blood count trend and stability 2. Repeat basic metabolic panel to follow electrolytes and renal function     Discharge Diagnoses:  Principal Problem:   Hypotension Active Problems:   Breast cancer (HCC)   Pancytopenia (HCC)   Hyponatremia   Dehydration   Hyperglycemia   Hypoalbuminemia due to protein-calorie malnutrition (HCC)   Nausea   Generalized weakness   GERD (gastroesophageal reflux disease)   Febrile neutropenia (HCC)   Discharge Condition: Stable and improved.  Discharged home instruction to follow-up with PCP and oncology services outpatient.  CODE STATUS: Full code.  Diet recommendation: Regular diet.  Filed Weights   06/26/20 0237  Weight: 77.1 kg    History of present illness:  As per H&P written by Dr. 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 She states that she had a similar episode after her last cycle of chemotherapy about 2 weeks ago, but it was not this severe. She states that she is having at least 3-4 loose bowel movements daily. She denied any fevers, chills, emesis, hematochezia, melena, dysuria, hematuria. She denies any other new medications. Her last chemotherapy infusion was on 06/20/2020.She is complaining of some myalgias but denies any frank synovitis. She denies any chest pain, shortness of breath, coughing, hemoptysis. In the emergency  department, the patient was afebrile, but she had tachycardia and hypotension with systolic blood pressure in the 80s and heart rate~110.Patient was given 2 L of normal saline with some improvement for blood pressure. WBC was noted to be 0.3 with hemoglobin 10.3, platelets 95,000 BMP showed a sodium 131, sodium one 4.0, serum creatinine 0.6. Because of her leukopenia and continued soft blood pressures, the patient was admitted for further evaluation and treatment  Hospital Course:  1-neutropenic fever -Treated with IV antibiotics until completion 48 hours without fever -Patient has received Granix on 06/26/2020; will follow neutrophil counts. -Cultures, C. difficile and GI panel negative/nonreactive. -Patient will be discharged with 5 more days of Augmentin twice a day to complete antibiotic therapy. -Patient advised to keep herself hydrated -At discharge WBCs in the 10,000 range.  2-hypotension -In the setting of volume depletion -Improving/resolved with fluid resuscitation -Advised to maintain adequate hydration.  3-Breast cancer [facility]) -Actively receiving chemotherapy -Continue patient follow-up with oncology service.  4-Pancytopenia ([place]) -In the setting of chemotherapy and malignancy -Continue to follow cell count trend -No signs of spontaneous bleeding. -Hemoglobin close to her baseline. -Leukopenia/neutropenia treatment as mentioned above. -Repeat CBC at follow-up visit to reassess complete blood cell count stability).  5-gastroesophageal reflux disease -Continue PPI. -Lifestyle changes discussed with patient  6-nausea/vomiting -In the setting of chemotherapy most likely -Continue as needed antiemetics -No further nausea vomiting appreciated during hospitalization -Patient tolerating diet without problems.  7-hyponatremia/dehydration -Improved and corrected after fluid resuscitation -Advance to maintain adequate hydration and observation -Repeat basic metabolic  panel at follow-up visit to reassess stability.  8-depression/anxiety -Continue Lexapro -Mood overall stable -No suicidal ideation or hallucinations.  Procedures:  See below for x-ray reports.  Consultations:  None  Discharge Exam: Vitals:   06/27/20 2138 06/28/20 0516  BP: (!) 115/55 (!) 82/52  Pulse: 89 76  Resp: 16 20  Temp: 98.7 F (37.1 C) 98.3 F (36.8 C)  SpO2: 99% 99%    General: Afebrile chest pain, no nausea, no vomiting.  Tolerating diet and feeling ready to go home. Cardiovascular: S1 and S2, no rubs, no gallops, no JVD. Respiratory: Clear to auscultation bilaterally; no use of accessory muscles.  Good saturation on room air. Abdomen: Soft, nontender, nondistended, positive bowel sounds Extremities: No cyanosis or clubbing.  Discharge Instructions   Discharge Instructions    Diet - low sodium heart healthy   Complete by: As directed    Discharge instructions   Complete by: As directed    Maintain adequate hydration Take medications as prescribed Follow-up with PCP in 10 days Continue outpatient follow-up with oncology service as previously instructed   Increase activity slowly   Complete by: As directed      Allergies as of 06/28/2020      Reactions   Ondansetron Hives, Itching   Bactrim [sulfamethoxazole-trimethoprim] Hives      Medication List    TAKE these medications   amoxicillin-clavulanate 875-125 MG tablet Commonly known as: Augmentin Take 1 tablet by mouth 2 (two) times daily for 5 days.   cholecalciferol 25 MCG (1000 UNIT) tablet Commonly known as: VITAMIN D3 Take 1,000 Units by mouth daily.   dexamethasone 4 MG tablet Commonly known as: DECADRON Take 8 mg by mouth See admin instructions. Take 8 mg on day 2,3, and 4 after chemo   diphenoxylate-atropine 2.5-0.025 MG tablet Commonly known as: LOMOTIL Take 1 tablet by mouth 4 (four) times daily as needed.   escitalopram 10 MG tablet Commonly known as: LEXAPRO Take 10 mg by  mouth daily.   gabapentin 300 MG capsule Commonly known as: NEURONTIN Take 300 mg by mouth 3 (three) times daily.   linaclotide 145 MCG Caps capsule Commonly known as: LINZESS Take 1 capsule (145 mcg total) by mouth daily before breakfast.   pantoprazole 40 MG tablet Commonly known as: PROTONIX Take 1 tablet (40 mg total) by mouth daily.   prochlorperazine 10 MG tablet Commonly known as: COMPAZINE Take 1 tablet (10 mg total) by mouth 2 (two) times daily as needed for nausea.   rizatriptan 10 MG tablet Commonly known as: MAXALT Take 10 mg by mouth as needed for migraine. May repeat in 2 hours if needed   topiramate 50 MG tablet Commonly known as: TOPAMAX Take 50 mg by mouth 2 (two) times daily.   vitamin C 1000 MG tablet Take 1,000 mg by mouth daily.      Allergies  Allergen Reactions  . Ondansetron Hives and Itching  . Bactrim [Sulfamethoxazole-Trimethoprim] Hives    Follow-up Information    Neale Burly, MD. Schedule an appointment as soon as possible for a visit in 10 day(s).   Specialty: Internal Medicine Contact information: [place] [address] 604 [phone]                The results of significant diagnostics from this hospitalization (including imaging, microbiology, ancillary and laboratory) are listed below for reference.    Significant Diagnostic Studies: DG CHEST PORT 1 VIEW  Result Date: 06/26/2020 CLINICAL DATA:  Febrile neutropenia. EXAM: PORTABLE CHEST 1 VIEW COMPARISON:  Radiograph 05/29/2020.  CT 03/19/2020 FINDINGS: Right chest port remains in place.The cardiomediastinal contours are normal. The lungs are clear. Pulmonary vasculature is  normal. No consolidation, pleural effusion, or pneumothorax. Irregularity of the right third rib on prior exam is not seen currently. No acute osseous abnormalities are seen. IMPRESSION: No acute chest findings. Electronically Signed   By: Keith Rake M.D.   On: 06/26/2020 19:39     Microbiology: Recent Results (from the past 240 hour(s))  Resp Panel by RT-PCR (Flu A&B, Covid) Nasopharyngeal Swab     Status: None   Collection Time: 06/26/20  6:00 AM   Specimen: Nasopharyngeal Swab; Nasopharyngeal(NP) swabs in vial transport medium  Result Value Ref Range Status   SARS Coronavirus 2 by RT PCR NEGATIVE NEGATIVE Final    Comment: (NOTE) SARS-CoV-2 target nucleic acids are NOT DETECTED.  The SARS-CoV-2 RNA is generally detectable in upper respiratory specimens during the acute phase of infection. The lowest concentration of SARS-CoV-2 viral copies this assay can detect is 138 copies/mL. A negative result does not preclude SARS-Cov-2 infection and should not be used as the sole basis for treatment or other patient management decisions. A negative result may occur with  improper specimen collection/handling, submission of specimen other than nasopharyngeal swab, presence of viral mutation(s) within the areas targeted by this assay, and inadequate number of viral copies(<138 copies/mL). A negative result must be combined with clinical observations, patient history, and epidemiological information. The expected result is Negative.  Fact Sheet for Patients:  EntrepreneurPulse.com.au  Fact Sheet for Healthcare Providers:  IncredibleEmployment.be  This test is no t yet approved or cleared by the [place] FDA and  has been authorized for detection and/or diagnosis of SARS-CoV-2 by FDA under an Emergency Use Authorization (EUA). This EUA will remain  in effect (meaning this test can be used) for the duration of the COVID-19 declaration under Section 564(b)(1) of the Act, 21 U.S.C.section 360bbb-3(b)(1), unless the authorization is terminated  or revoked sooner.       Influenza A by PCR NEGATIVE NEGATIVE Final   Influenza B by PCR NEGATIVE NEGATIVE Final    Comment: (NOTE) The Xpert Xpress SARS-CoV-2/FLU/RSV plus assay is  intended as an aid in the diagnosis of influenza from Nasopharyngeal swab specimens and should not be used as a sole basis for treatment. Nasal washings and aspirates are unacceptable for Xpert Xpress SARS-CoV-2/FLU/RSV testing.  Fact Sheet for Patients: EntrepreneurPulse.com.au  Fact Sheet for Healthcare Providers: IncredibleEmployment.be  This test is not yet approved or cleared by the [place] FDA and has been authorized for detection and/or diagnosis of SARS-CoV-2 by FDA under an Emergency Use Authorization (EUA). This EUA will remain in effect (meaning this test can be used) for the duration of the COVID-19 declaration under Section 564(b)(1) of the Act, 21 U.S.C. section 360bbb-3(b)(1), unless the authorization is terminated or revoked.  Performed at [facility], [address] [place], [place] [address]   Culture, blood (Routine X 2) w Reflex to ID Panel     Status: None (Preliminary result)   Collection Time: 06/26/20  8:21 AM   Specimen: BLOOD RIGHT HAND  Result Value Ref Range Status   Specimen Description BLOOD RIGHT HAND  Final   Special Requests   Final    BOTTLES DRAWN AEROBIC AND ANAEROBIC Blood Culture adequate volume   Culture   Final    NO GROWTH 2 DAYS Performed at [facility], [address], [place], [place] [address]    Report Status PENDING  Incomplete  Culture, blood (Routine X 2) w Reflex to ID Panel     Status: None (Preliminary result)  Collection Time: 06/26/20  8:22 AM   Specimen: BLOOD  Result Value Ref Range Status   Specimen Description BLOOD LEFT ANTECUBITAL  Final   Special Requests   Final    Blood Culture adequate volume BOTTLES DRAWN AEROBIC AND ANAEROBIC   Culture   Final    NO GROWTH 2 DAYS Performed at [facility], [address], [place], [place] [address]    Report Status PENDING  Incomplete  Culture, Urine     Status: None   Collection Time: 06/26/20 10:15 AM   Specimen: Urine, Clean  Catch  Result Value Ref Range Status   Specimen Description   Final    URINE, CLEAN CATCH Performed at [facility], [address], [place], [place] [address]    Special Requests   Final    NONE Performed at [facility], [address], [place], [place] [address]    Culture   Final    NO GROWTH Performed at [facility], [place] [address], [place], [place] [address]    Report Status 06/27/2020 FINAL  Final  Gastrointestinal Panel by PCR , Stool     Status: None   Collection Time: 06/26/20  5:11 PM   Specimen: Urine, Random; Stool  Result Value Ref Range Status   Campylobacter species NOT DETECTED NOT DETECTED Final   Plesimonas shigelloides NOT DETECTED NOT DETECTED Final   Salmonella species NOT DETECTED NOT DETECTED Final   Yersinia enterocolitica NOT DETECTED NOT DETECTED Final   Vibrio species NOT DETECTED NOT DETECTED Final   Vibrio cholerae NOT DETECTED NOT DETECTED Final   Enteroaggregative E coli (EAEC) NOT DETECTED NOT DETECTED Final   Enteropathogenic E coli (EPEC) NOT DETECTED NOT DETECTED Final   Enterotoxigenic E coli (ETEC) NOT DETECTED NOT DETECTED Final   Shiga like toxin producing E coli (STEC) NOT DETECTED NOT DETECTED Final   Shigella/Enteroinvasive E coli (EIEC) NOT DETECTED NOT DETECTED Final   Cryptosporidium NOT DETECTED NOT DETECTED Final   Cyclospora cayetanensis NOT DETECTED NOT DETECTED Final   Entamoeba histolytica NOT DETECTED NOT DETECTED Final   Giardia lamblia NOT DETECTED NOT DETECTED Final   Adenovirus F40/41 NOT DETECTED NOT DETECTED Final   Astrovirus NOT DETECTED NOT DETECTED Final   Norovirus GI/GII NOT DETECTED NOT DETECTED Final   Rotavirus A NOT DETECTED NOT DETECTED Final   Sapovirus (I, II, IV, and V) NOT DETECTED NOT DETECTED Final    Comment: Performed at [facility], [address] [place],  [address]     Labs: Basic Metabolic Panel: Recent Labs  Lab 06/26/20 0253 06/27/20 0430 06/28/20 0536  NA 131*  134* 137  K 4.0 3.8 3.5  CL 99 104 102  CO2 23 23 25   GLUCOSE 156* 99 100*  BUN 15 7 6   CREATININE 0.86 0.55 0.64  CALCIUM 8.9 8.5* 8.6*  MG  --  1.8  --   PHOS  --  2.4*  --    Liver Function Tests: Recent Labs  Lab 06/26/20 0253 06/27/20 0430  AST 12* 9*  ALT 24 17  ALKPHOS 46 38  BILITOT 0.8 0.5  PROT 6.8 5.6*  ALBUMIN 3.4* 2.8*   Recent Labs  Lab 06/26/20 0253  LIPASE 26   CBC: Recent Labs  Lab 06/26/20 0253 06/27/20 0430 06/28/20 0536  WBC 0.3* 1.2* 10.8*  NEUTROABS 0.0* 0.3* 6.8  HGB 10.3* 8.3* 8.4*  HCT 30.6* 24.7* 25.8*  MCV 97.1 99.2 98.9  PLT 95* 69* 76*  Signed:  Barton Dubois MD.  [facility] 06/28/2020, 3:01 PM

## 2020-06-28 NOTE — Progress Notes (Signed)
Nsg Discharge Note  Admit Date:  06/26/2020 Discharge date: 06/28/2020   Anna Stevenson to be D/C'd  Home  per MD order.  AVS completed.  Copy for chart, and copy for patient signed, and dated. Patient/caregiver able to verbalize understanding.  Discharge Medication: Allergies as of 06/28/2020      Reactions   Ondansetron Hives, Itching   Bactrim [sulfamethoxazole-trimethoprim] Hives      Medication List    TAKE these medications   amoxicillin-clavulanate 875-125 MG tablet Commonly known as: Augmentin Take 1 tablet by mouth 2 (two) times daily for 5 days.   cholecalciferol 25 MCG (1000 UNIT) tablet Commonly known as: VITAMIN D3 Take 1,000 Units by mouth daily.   dexamethasone 4 MG tablet Commonly known as: DECADRON Take 8 mg by mouth See admin instructions. Take 8 mg on day 2,3, and 4 after chemo   diphenoxylate-atropine 2.5-0.025 MG tablet Commonly known as: LOMOTIL Take 1 tablet by mouth 4 (four) times daily as needed.   escitalopram 10 MG tablet Commonly known as: LEXAPRO Take 10 mg by mouth daily.   gabapentin 300 MG capsule Commonly known as: NEURONTIN Take 300 mg by mouth 3 (three) times daily.   linaclotide 145 MCG Caps capsule Commonly known as: LINZESS Take 1 capsule (145 mcg total) by mouth daily before breakfast.   pantoprazole 40 MG tablet Commonly known as: PROTONIX Take 1 tablet (40 mg total) by mouth daily.   prochlorperazine 10 MG tablet Commonly known as: COMPAZINE Take 1 tablet (10 mg total) by mouth 2 (two) times daily as needed for nausea.   rizatriptan 10 MG tablet Commonly known as: MAXALT Take 10 mg by mouth as needed for migraine. May repeat in 2 hours if needed   topiramate 50 MG tablet Commonly known as: TOPAMAX Take 50 mg by mouth 2 (two) times daily.   vitamin C 1000 MG tablet Take 1,000 mg by mouth daily.       Discharge Assessment: Vitals:   06/27/20 2138 06/28/20 0516  BP: (!) 115/55 (!) 82/52  Pulse: 89 76  Resp:  16 20  Temp: 98.7 F (37.1 C) 98.3 F (36.8 C)  SpO2: 99% 99%   Skin clean, dry and intact without evidence of skin break down, no evidence of skin tears noted.  Pt port deacessed and hep locked. Pt tolerated well.  D/c Instructions-Education: Discharge instructions given to patient/family with verbalized understanding. D/c education completed with patient/family including follow up instructions, medication list, d/c activities limitations if indicated, with other d/c instructions as indicated by MD - patient able to verbalize understanding, all questions fully answered. Patient instructed to return to ED, call 911, or call MD for any changes in condition.  Patient escorted via [place], and D/C home via private auto.  Clovis Fredrickson, RN 06/28/2020 4:12 PM

## 2020-06-28 NOTE — Progress Notes (Signed)
Port hep locked and deaccessed.  No bleeding noted.  Discharge instructions reviewed and script sent to pharmachy.

## 2020-07-01 LAB — CULTURE, BLOOD (ROUTINE X 2)
Culture: NO GROWTH
Culture: NO GROWTH
Special Requests: ADEQUATE
Special Requests: ADEQUATE

## 2021-05-12 IMAGING — DX DG CHEST 1V PORT
1 series · 1 of 1 positions shown · non-contrast
Comparison: 03/29/2020

CLINICAL DATA: Shortness of breath and cough

EXAM:
PORTABLE CHEST 1 VIEW

[chest ap]
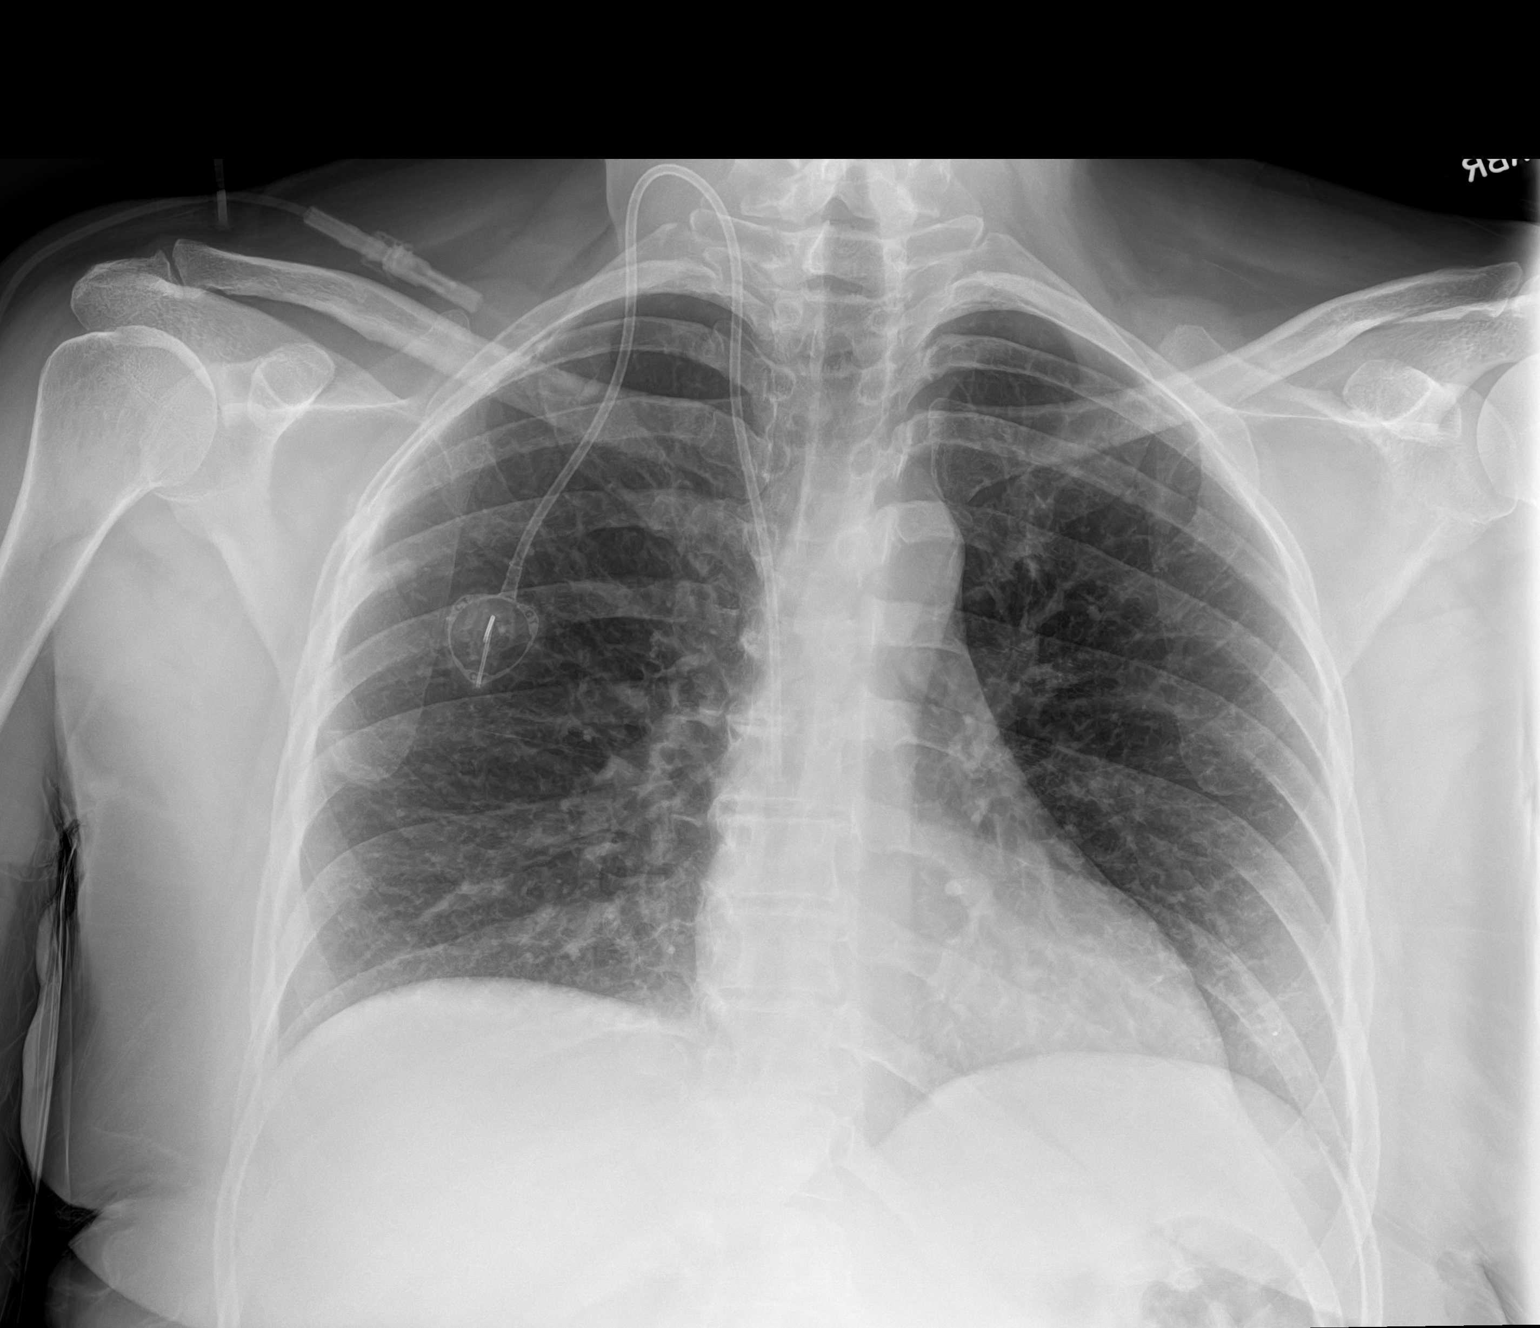

[1 of 1 positions shown; findings below may reference images not displayed]

FINDINGS: Porta catheter on the right with tip at the SVC.

Normal heart size and mediastinal contours.

There is no edema, consolidation, effusion, or pneumothorax.

Irregularity of the lateral right third rib with suspected regional
callus.
IMPRESSION: 1. Clear lungs.
2. Irregular appearance of the lateral right third rib, favor
subacute fracture. No lesion was seen in this area on a staging CT
February 2020.

## 2021-12-25 ENCOUNTER — Encounter: Payer: Self-pay | Admitting: *Deleted

## 2022-01-03 ENCOUNTER — Encounter: Payer: Self-pay | Admitting: *Deleted

## 2022-01-03 NOTE — Patient Instructions (Signed)
  Procedure: Colonoscopy  Estimated body mass index is 29.68 kg/m as calculated from the following:   Height as of this encounter: '5\' 9"'$  (1.753 m).   Weight as of this encounter: 201 lb (91.2 kg).   Have you had a colonoscopy before?  no  Do you have family history of colon cancer  yes father  Do you have a family history of polyps? no  Previous colonoscopy with polyps removed?   Do you have a history colorectal cancer?   no  Are you diabetic?  no  Do you have a prosthetic or mechanical heart valve? no  Do you have a pacemaker/defibrillator?   no  Have you had endocarditis/atrial fibrillation?  no  Do you use supplemental oxygen/CPAP?  no  Have you had joint replacement within the last 12 months?  no  Do you tend to be constipated or have to use laxatives?  no   Do you have history of alcohol use? If yes, how much and how often.  Yes, sometimes  Do you have history or are you using drugs? If yes, what do are you  using?  no  Have you ever had a stroke/heart attack?  no  Have you ever had a heart or other vascular stent placed,?no  Do you take weight loss medication? no  female patients,: have you had a hysterectomy? yes                              are you post menopausal?  yes                              do you still have your menstrual cycle? no    Date of last menstrual period. 2013  Do you take any blood-thinning medications such as: (Plavix, aspirin, Coumadin, Aggrenox, Brilinta, Xarelto, Eliquis, Pradaxa, Savaysa or Effient) no  If yes we need the name, milligram, dosage and who is prescribing doctor:               Current Outpatient Medications  Medication Sig Dispense Refill   famotidine (PEPCID) 20 MG tablet Take 20 mg by mouth daily.     furosemide (LASIX) 20 MG tablet Take 20 mg by mouth daily.     gabapentin (NEURONTIN) 400 MG capsule Take 300 mg by mouth 3 (three) times daily.     venlafaxine (EFFEXOR) 37.5 MG tablet Take 37.5 mg by mouth 2 (two)  times daily.     linaclotide (LINZESS) 145 MCG CAPS capsule Take 1 capsule (145 mcg total) by mouth daily before breakfast. 30 capsule 5   No current facility-administered medications for this visit.    Allergies  Allergen Reactions   Ondansetron Hives and Itching   Bactrim [Sulfamethoxazole-Trimethoprim] Hives

## 2022-01-07 NOTE — Progress Notes (Signed)
Okay to schedule.  ASA 2.  Needs BMP due to Lasix.

## 2022-01-07 NOTE — Progress Notes (Signed)
LMOVM to call back 

## 2022-01-09 ENCOUNTER — Encounter: Payer: Self-pay | Admitting: *Deleted

## 2022-01-09 DIAGNOSIS — Z1211 Encounter for screening for malignant neoplasm of colon: Secondary | ICD-10-CM

## 2022-01-09 MED ORDER — PEG 3350-KCL-NA BICARB-NACL 420 G PO SOLR: 4000.0000 mL | Freq: Once | ORAL | 0 refills | Status: AC

## 2022-01-09 NOTE — Progress Notes (Signed)
Pt scheduled for 02/06/22 for 8:30 am. Instructions mailed and prep sent to the pharmacy

## 2022-01-14 ENCOUNTER — Telehealth (HOSPITAL_COMMUNITY): Payer: Self-pay

## 2022-01-14 NOTE — Telephone Encounter (Signed)
Called pt to schedule appt with Dr Stinson no answer left vm 

## 2022-01-15 ENCOUNTER — Encounter: Payer: Self-pay | Admitting: *Deleted

## 2022-01-17 NOTE — Telephone Encounter (Signed)
Pt called back - scheduled

## 2022-01-29 ENCOUNTER — Ambulatory Visit (HOSPITAL_COMMUNITY): Payer: Self-pay | Admitting: Psychiatry

## 2022-01-30 ENCOUNTER — Other Ambulatory Visit: Payer: Self-pay | Admitting: *Deleted

## 2022-01-30 DIAGNOSIS — Z1211 Encounter for screening for malignant neoplasm of colon: Secondary | ICD-10-CM

## 2022-02-05 ENCOUNTER — Other Ambulatory Visit (HOSPITAL_COMMUNITY)
Admission: RE | Admit: 2022-02-05 | Discharge: 2022-02-05 | Disposition: A | Discharge status: Home / Self Care | Payer: BC Managed Care – PPO | Source: Ambulatory Visit | Attending: Internal Medicine | Admitting: Internal Medicine

## 2022-02-05 DIAGNOSIS — G629 Polyneuropathy, unspecified: Secondary | ICD-10-CM | POA: Diagnosis not present

## 2022-02-05 DIAGNOSIS — J45909 Unspecified asthma, uncomplicated: Secondary | ICD-10-CM | POA: Diagnosis not present

## 2022-02-05 DIAGNOSIS — Z800 Family history of malignant neoplasm of digestive organs: Secondary | ICD-10-CM | POA: Diagnosis present

## 2022-02-05 DIAGNOSIS — K219 Gastro-esophageal reflux disease without esophagitis: Secondary | ICD-10-CM | POA: Diagnosis not present

## 2022-02-05 DIAGNOSIS — Z1211 Encounter for screening for malignant neoplasm of colon: Secondary | ICD-10-CM | POA: Insufficient documentation

## 2022-02-05 DIAGNOSIS — G43909 Migraine, unspecified, not intractable, without status migrainosus: Secondary | ICD-10-CM | POA: Diagnosis not present

## 2022-02-05 LAB — BASIC METABOLIC PANEL
Anion gap: 10 (ref 5–15)
BUN: 17 mg/dL (ref 6–20)
CO2: 27 mmol/L (ref 22–32)
Calcium: 9.4 mg/dL (ref 8.9–10.3)
Chloride: 102 mmol/L (ref 98–111)
Creatinine, Ser: 0.79 mg/dL (ref 0.44–1.00)
GFR, Estimated: >60 mL/min (ref >60)
Glucose, Bld: 91 mg/dL (ref 70–99)
Potassium: 3.6 mmol/L (ref 3.5–5.1)
Sodium: 139 mmol/L (ref 135–145)

## 2022-02-06 ENCOUNTER — Ambulatory Visit (HOSPITAL_COMMUNITY)
Admission: RE | Admit: 2022-02-06 | Discharge: 2022-02-06 | Disposition: A | Discharge status: Home / Self Care | Payer: BC Managed Care – PPO | Attending: Internal Medicine | Admitting: Internal Medicine

## 2022-02-06 ENCOUNTER — Encounter (HOSPITAL_COMMUNITY)
Admission: RE | Disposition: A | Discharge status: Home / Self Care | Payer: Self-pay | Source: Home / Self Care | Attending: Internal Medicine

## 2022-02-06 ENCOUNTER — Ambulatory Visit (HOSPITAL_COMMUNITY): Payer: BC Managed Care – PPO | Admitting: Anesthesiology

## 2022-02-06 ENCOUNTER — Other Ambulatory Visit: Payer: Self-pay

## 2022-02-06 ENCOUNTER — Encounter (HOSPITAL_COMMUNITY): Payer: Self-pay

## 2022-02-06 DIAGNOSIS — Z1211 Encounter for screening for malignant neoplasm of colon: Secondary | ICD-10-CM | POA: Diagnosis not present

## 2022-02-06 DIAGNOSIS — G629 Polyneuropathy, unspecified: Secondary | ICD-10-CM | POA: Insufficient documentation

## 2022-02-06 DIAGNOSIS — Z1212 Encounter for screening for malignant neoplasm of rectum: Secondary | ICD-10-CM | POA: Diagnosis not present

## 2022-02-06 DIAGNOSIS — Z800 Family history of malignant neoplasm of digestive organs: Secondary | ICD-10-CM | POA: Insufficient documentation

## 2022-02-06 DIAGNOSIS — J45909 Unspecified asthma, uncomplicated: Secondary | ICD-10-CM | POA: Insufficient documentation

## 2022-02-06 DIAGNOSIS — K219 Gastro-esophageal reflux disease without esophagitis: Secondary | ICD-10-CM | POA: Insufficient documentation

## 2022-02-06 DIAGNOSIS — G43909 Migraine, unspecified, not intractable, without status migrainosus: Secondary | ICD-10-CM | POA: Insufficient documentation

## 2022-02-06 HISTORY — PX: COLONOSCOPY WITH PROPOFOL: SHX5780

## 2022-02-06 SURGERY — COLONOSCOPY WITH PROPOFOL
Anesthesia: Monitor Anesthesia Care

## 2022-02-06 MED ORDER — PROPOFOL 500 MG/50ML IV EMUL
INTRAVENOUS | Status: DC | PRN
  Administered 2022-02-06: 150 ug/kg/min via INTRAVENOUS

## 2022-02-06 MED ORDER — PROPOFOL 10 MG/ML IV BOLUS
INTRAVENOUS | Status: DC | PRN
  Administered 2022-02-06: 100 mg via INTRAVENOUS

## 2022-02-06 MED ORDER — LACTATED RINGERS IV SOLN: INTRAVENOUS | Status: DC

## 2022-02-06 NOTE — Discharge Instructions (Addendum)
  Colonoscopy Discharge Instructions  Read the instructions outlined below and refer to this sheet in the next few weeks. These discharge instructions provide you with general information on caring for yourself after you leave the hospital. Your doctor may also give you specific instructions. While your treatment has been planned according to the most current medical practices available, unavoidable complications occasionally occur.   ACTIVITY You may resume your regular activity, but move at a slower pace for the next 24 hours.  Take frequent rest periods for the next 24 hours.  Walking will help get rid of the air and reduce the bloated feeling in your belly (abdomen).  No driving for 24 hours (because of the medicine (anesthesia) used during the test).   Do not sign any important legal documents or operate any machinery for 24 hours (because of the anesthesia used during the test).  NUTRITION Drink plenty of fluids.  You may resume your normal diet as instructed by your doctor.  Begin with a light meal and progress to your normal diet. Heavy or fried foods are harder to digest and may make you feel sick to your stomach (nauseated).  Avoid alcoholic beverages for 24 hours or as instructed.  MEDICATIONS You may resume your normal medications unless your doctor tells you otherwise.  WHAT YOU CAN EXPECT TODAY Some feelings of bloating in the abdomen.  Passage of more gas than usual.  Spotting of blood in your stool or on the toilet paper.  IF YOU HAD POLYPS REMOVED DURING THE COLONOSCOPY: No aspirin products for 7 days or as instructed.  No alcohol for 7 days or as instructed.  Eat a soft diet for the next 24 hours.  FINDING OUT THE RESULTS OF YOUR TEST Not all test results are available during your visit. If your test results are not back during the visit, make an appointment with your caregiver to find out the results. Do not assume everything is normal if you have not heard from your  caregiver or the medical facility. It is important for you to follow up on all of your test results.  SEEK IMMEDIATE MEDICAL ATTENTION IF: You have more than a spotting of blood in your stool.  Your belly is swollen (abdominal distention).  You are nauseated or vomiting.  You have a temperature over 101.  You have abdominal pain or discomfort that is severe or gets worse throughout the day.   Your colonoscopy was relatively unremarkable.  I did not find any polyps or evidence of colon cancer.  I recommend repeating colonoscopy in 5 years for colon cancer screening purposes given your family history of colon cancer in father.  Otherwise follow-up with GI as needed.   I hope you have a great rest of your week!  Elon Alas. Abbey Chatters, D.O. Gastroenterology and Hepatology [facility]

## 2022-02-06 NOTE — Anesthesia Postprocedure Evaluation (Signed)
Anesthesia Post Note  Patient: Sports coach  Procedure(s) Performed: COLONOSCOPY WITH PROPOFOL  Patient location during evaluation: Phase II Anesthesia Type: General Level of consciousness: awake and alert and oriented Pain management: pain level controlled Vital Signs Assessment: post-procedure vital signs reviewed and stable Respiratory status: spontaneous breathing, nonlabored ventilation and respiratory function stable Cardiovascular status: blood pressure returned to baseline and stable Postop Assessment: no apparent nausea or vomiting Anesthetic complications: no  No notable events documented.   Last Vitals:  Vitals:   02/06/22 0725 02/06/22 0908  BP: 133/76 (!) 95/59  Pulse: 74 66  Resp: 15 15  Temp: 36.6 C 36.5 C  SpO2: 100% 99%    Last Pain:  Vitals:   02/06/22 0908  TempSrc: Oral  PainSc: 0-No pain                 Kaly Mcquary C Ramal Eckhardt

## 2022-02-06 NOTE — Transfer of Care (Signed)
Immediate Anesthesia Transfer of Care Note  Patient: Anna Stevenson  Procedure(s) Performed: COLONOSCOPY WITH PROPOFOL  Patient Location: short stay  Anesthesia Type:General  Level of Consciousness: awake, alert , oriented, and patient cooperative  Airway & Oxygen Therapy: Patient Spontanous Breathing  Post-op Assessment: Report given to RN, Post -op Vital signs reviewed and stable, and Patient moving all extremities X 4  Post vital signs: Reviewed and stable  Last Vitals:  Vitals Value Taken Time  BP    Temp    Pulse    Resp    SpO2      Last Pain:  Vitals:   02/06/22 0725  TempSrc: Oral  PainSc:       Patients Stated Pain Goal: 10 (99/69/24 9324)  Complications: No notable events documented.

## 2022-02-06 NOTE — Op Note (Signed)
[facility] Patient Name: Anna Stevenson Procedure Date: 02/06/2022 8:03 AM MRN: [id number] Date of Birth: [birth year] Attending MD: Elon Alas. Edgar Frisk, [id number] CSN: [id number] Age: 51 Admit Type: Outpatient Procedure:                Colonoscopy Indications:              Screening in patient at increased risk: Colorectal                            cancer in father before age 67 Providers:                Elon Alas. Abbey Chatters, DO, Janeece Riggers, RN, Everardo Pacific Referring MD:             Elon Alas. Abbey Chatters, DO Medicines:                See the Anesthesia note for documentation of the                            administered medications Complications:            No immediate complications. Estimated Blood Loss:     Estimated blood loss: none. Procedure:                Pre-Anesthesia Assessment:                           - The anesthesia plan was to use monitored                            anesthesia care (MAC).                           After obtaining informed consent, the colonoscope                            was passed under direct vision. Throughout the                            procedure, the patient's blood pressure, pulse, and                            oxygen saturations were monitored continuously. The                            [id number]-HQ190L ([id number]) scope was introduced through                            the anus and advanced to the the cecum, identified                            by appendiceal orifice and ileocecal valve. The                            colonoscopy was  performed without difficulty. The                            patient tolerated the procedure well. The quality                            of the bowel preparation was evaluated using the                            BBPS [facility] Bowel Preparation Scale) with scores                            of: Right Colon = 3, Transverse Colon = 3 and Left                            Colon  = 3 (entire mucosa seen well with no residual                            staining, small fragments of stool or opaque                            liquid). The total BBPS score equals 9. Scope In: 8:54:21 AM Scope Out: 9:04:44 AM Scope Withdrawal Time: 0 hours 7 minutes 16 seconds  Total Procedure Duration: 0 hours 10 minutes 23 seconds  Findings:      The perianal and digital rectal examinations were normal.      The entire examined colon appeared normal. Impression:               - The entire examined colon is normal.                           - No specimens collected. Moderate Sedation:      Per Anesthesia Care Recommendation:           - Patient has a contact number available for                            emergencies. The signs and symptoms of potential                            delayed complications were discussed with the                            patient. Return to normal activities tomorrow.                            Written discharge instructions were provided to the                            patient.                           - Resume previous diet.                           -  Continue present medications.                           - Repeat colonoscopy in 5 years for screening                            purposes due to family history of colon cancer in                            father.                           - Return to GI clinic PRN. Procedure Code(s):        --- Professional ---                           X5170, Colorectal cancer screening; colonoscopy on                            individual at high risk Diagnosis Code(s):        --- Professional ---                           Z80.0, Family history of malignant neoplasm of                            digestive organs CPT copyright 2022 American [employer] Association. All rights reserved. The codes documented in this report are preliminary and upon coder review may  be revised to meet current compliance  requirements. Elon Alas. Abbey Chatters, DO [place] Abbey Chatters, DO 02/06/2022 9:08:03 AM This report has been signed electronically. Number of Addenda: 0

## 2022-02-06 NOTE — H&P (Signed)
Primary Care Physician:  Neale Burly, MD Primary Gastroenterologist:  Dr. Abbey Chatters  Pre-Procedure History & Physical: HPI:  Anna Stevenson is a 51 y.o. female is here for a colonoscopy to be performed for high risk colon cancer screening purposes, family history of colon cancer in father  Past Medical History:  Diagnosis Date   Asthma    Breast cancer ([place]) 01/2020   left   GERD (gastroesophageal reflux disease)    Migraines    Neuropathy    Seasonal allergies     Past Surgical History:  Procedure Laterality Date   ABDOMINAL HYSTERECTOMY     CESAREAN SECTION     x2   PORTA CATH INSERTION Right     Prior to Admission medications   Medication Sig Start Date End Date Taking? Authorizing Provider  famotidine (PEPCID) 20 MG tablet Take 20 mg by mouth daily.   Yes [provider]  furosemide (LASIX) 20 MG tablet Take 20 mg by mouth daily.   Yes [provider]  gabapentin (NEURONTIN) 400 MG capsule Take 300 mg by mouth 3 (three) times daily.   Yes [provider]  letrozole (FEMARA) 2.5 MG tablet Take 2.5 mg by mouth daily.   Yes [provider]  venlafaxine (EFFEXOR) 37.5 MG tablet Take 37.5 mg by mouth 2 (two) times daily.   Yes [provider]  linaclotide Rolan Lipa) 145 MCG CAPS capsule Take 1 capsule (145 mcg total) by mouth daily before breakfast. 04/12/20 10/09/20  [name], DO    Allergies as of 01/09/2022 - Review Complete 06/26/2020  Allergen Reaction Noted   Ondansetron Hives and Itching 05/22/2020   Bactrim [sulfamethoxazole-trimethoprim] Hives 11/03/2016    Family History  Problem Relation Age of Onset   Diabetes Mother    Stroke Mother    Stroke Father    Colon cancer Father        x2. Diagnoased in 41s.     Social History   Socioeconomic History   Marital status: Single    Spouse name: Not on file   Number of children: Not on file   Years of education: Not on file   Highest education level: Not on  file  Occupational History   Not on file  Tobacco Use   Smoking status: Never   Smokeless tobacco: Never  Vaping Use   Vaping Use: Never used  Substance and Sexual Activity   Alcohol use: Yes    Comment: 3x/week amt varies- about 3 drinks per occasion    Drug use: No   Sexual activity: Not on file  Other Topics Concern   Not on file  Social History Narrative   Not on file   Social Determinants of Health   Financial Resource Strain: Not on file  Food Insecurity: Not on file  Transportation Needs: Not on file  Physical Activity: Not on file  Stress: Not on file  Social Connections: Not on file  Intimate Partner Violence: Not on file    Review of Systems: See HPI, otherwise negative ROS  Physical Exam: Vital signs in last 24 hours: Temp:  [97.9 F (36.6 C)] 97.9 F (36.6 C) (11/09 0725) Pulse Rate:  [74] 74 (11/09 0725) Resp:  [15] 15 (11/09 0725) BP: (133)/(76) 133/76 (11/09 0725) SpO2:  [100 %] 100 % (11/09 0725) Weight:  [91.2 kg] 91.2 kg (11/09 0717)   General:   Alert,  Well-developed, well-nourished, pleasant and cooperative in NAD Head:  Normocephalic and atraumatic. Eyes:  Sclera clear,  no icterus.   Conjunctiva pink. Ears:  Normal auditory acuity. Nose:  No deformity, discharge,  or lesions. Mouth:  No deformity or lesions, dentition normal. Neck:  Supple; no masses or thyromegaly. Lungs:  Clear throughout to auscultation.   No wheezes, crackles, or rhonchi. No acute distress. Heart:  Regular rate and rhythm; no murmurs, clicks, rubs,  or gallops. Abdomen:  Soft, nontender and nondistended. No masses, hepatosplenomegaly or hernias noted. Normal bowel sounds, without guarding, and without rebound.   Msk:  Symmetrical without gross deformities. Normal posture. Extremities:  Without clubbing or edema. Neurologic:  Alert and  oriented x4;  grossly normal neurologically. Skin:  Intact without significant lesions or rashes. Cervical Nodes:  No significant  cervical adenopathy. Psych:  Alert and cooperative. Normal mood and affect.  Impression/Plan: Anna Stevenson is here for a colonoscopy to be performed for high risk colon cancer screening purposes, family history of colon cancer in father  The risks of the procedure including infection, bleed, or perforation as well as benefits, limitations, alternatives and imponderables have been reviewed with the patient. Questions have been answered. All parties agreeable.

## 2022-02-06 NOTE — Anesthesia Preprocedure Evaluation (Addendum)
Anesthesia Evaluation  Patient identified by MRN, date of birth, ID band Patient awake    Reviewed: Allergy & Precautions, H&P , NPO status , Patient's Chart, lab work & pertinent test results  Airway Mallampati: I  TM Distance: >3 FB Neck ROM: Full    Dental  (+) Dental Advisory Given, Missing   Pulmonary asthma    Pulmonary exam normal breath sounds clear to auscultation       Cardiovascular negative cardio ROS Normal cardiovascular exam Rhythm:Regular Rate:Normal     Neuro/Psych  Headaches  Neuromuscular disease  negative psych ROS   GI/Hepatic Neg liver ROS,GERD  Medicated and Controlled,,  Endo/Other  negative endocrine ROS    Renal/GU negative Renal ROS  negative genitourinary   Musculoskeletal negative musculoskeletal ROS (+)    Abdominal   Peds negative pediatric ROS (+)  Hematology  (+) Blood dyscrasia, anemia   Anesthesia Other Findings Breast cancer  Reproductive/Obstetrics negative OB ROS                             Anesthesia Physical Anesthesia Plan  ASA: 2  Anesthesia Plan:    Post-op Pain Management: Minimal or no pain anticipated   Induction: Intravenous  PONV Risk Score and Plan: Propofol infusion  Airway Management Planned: Nasal Cannula and Natural Airway  Additional Equipment:   Intra-op Plan:   Post-operative Plan:   Informed Consent: I have reviewed the patients History and Physical, chart, labs and discussed the procedure including the risks, benefits and alternatives for the proposed anesthesia with the patient or authorized representative who has indicated his/her understanding and acceptance.     Dental advisory given  Plan Discussed with: CRNA and Surgeon  Anesthesia Plan Comments:        Anesthesia Quick Evaluation

## 2022-02-12 ENCOUNTER — Encounter (HOSPITAL_COMMUNITY): Payer: Self-pay | Admitting: Internal Medicine

## 2022-04-02 ENCOUNTER — Other Ambulatory Visit (HOSPITAL_COMMUNITY): Payer: Self-pay | Admitting: Internal Medicine

## 2022-04-02 DIAGNOSIS — Z171 Estrogen receptor negative status [ER-]: Secondary | ICD-10-CM

## 2022-04-03 ENCOUNTER — Encounter (HOSPITAL_COMMUNITY)
Admission: RE | Admit: 2022-04-03 | Discharge: 2022-04-03 | Disposition: A | Discharge status: Home / Self Care | Payer: BC Managed Care – PPO | Source: Ambulatory Visit | Attending: Internal Medicine | Admitting: Internal Medicine

## 2022-04-03 DIAGNOSIS — Z171 Estrogen receptor negative status [ER-]: Secondary | ICD-10-CM | POA: Diagnosis present

## 2022-04-03 DIAGNOSIS — C50212 Malignant neoplasm of upper-inner quadrant of left female breast: Secondary | ICD-10-CM | POA: Diagnosis present

## 2022-04-03 MED ORDER — FLUDEOXYGLUCOSE F - 18 (FDG) INJECTION
10.5000 | Freq: Once | INTRAVENOUS | Status: AC | PRN
  Administered 2022-04-03: 10.5 via INTRAVENOUS

## 2022-05-12 ENCOUNTER — Other Ambulatory Visit: Payer: Self-pay

## 2022-05-12 ENCOUNTER — Emergency Department (HOSPITAL_COMMUNITY)
Admission: EM | Admit: 2022-05-12 | Discharge: 2022-05-12 | Disposition: A | Discharge status: Home / Self Care | Payer: BC Managed Care – PPO | Attending: Emergency Medicine | Admitting: Emergency Medicine

## 2022-05-12 ENCOUNTER — Encounter (HOSPITAL_COMMUNITY): Payer: Self-pay

## 2022-05-12 DIAGNOSIS — M654 Radial styloid tenosynovitis [de Quervain]: Secondary | ICD-10-CM | POA: Insufficient documentation

## 2022-05-12 DIAGNOSIS — M25532 Pain in left wrist: Secondary | ICD-10-CM

## 2022-05-12 MED ORDER — HYDROCODONE-ACETAMINOPHEN 5-325 MG PO TABS: 1.0000 | ORAL_TABLET | ORAL | 0 refills | Status: AC | PRN

## 2022-05-12 NOTE — ED Triage Notes (Signed)
Reports shooting pain in left wrist that goes up arm.  Reports it has been going on for a while and did therapy but its getting worse.

## 2022-05-12 NOTE — ED Provider Notes (Signed)
[facility] Provider Note   CSN: [id number] Arrival date & time: 05/12/22  [id number]     History {Add pertinent medical, surgical, social history, OB history to HPI:1} Chief Complaint  Patient presents with  . Arm Pain    Anna Stevenson is a 52 y.o. female.  52 year old female with a history of left wrist pain who presents to the emergency department with left wrist pain.  Says that this has been going on for months.  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  Denies any injuries to her wrist.  Saw an outpatient doctor and      Home Medications Prior to Admission medications   Medication Sig Start Date End Date Taking? Authorizing Provider  HYDROcodone-acetaminophen (NORCO/VICODIN) 5-325 MG tablet Take 1 tablet by mouth every 4 (four) hours as needed. 05/12/22  Yes Fransico Meadow, MD  famotidine (PEPCID) 20 MG tablet Take 20 mg by mouth daily.    [provider]  furosemide (LASIX) 20 MG tablet Take 20 mg by mouth daily.    [provider]  gabapentin (NEURONTIN) 400 MG capsule Take 300 mg by mouth 3 (three) times daily.    [provider]  letrozole (FEMARA) 2.5 MG tablet Take 2.5 mg by mouth daily.    [provider]  linaclotide Rolan Lipa) 145 MCG CAPS capsule Take 1 capsule (145 mcg total) by mouth daily before breakfast. 04/12/20 10/09/20  [name], DO  venlafaxine (EFFEXOR) 37.5 MG tablet Take 37.5 mg by mouth 2 (two) times daily.    [provider]      Allergies    Ondansetron and Bactrim [sulfamethoxazole-trimethoprim]    Review of Systems   Review of Systems  Physical Exam Updated Vital Signs BP 133/79 (BP Location: Right Arm)   Pulse 85   Temp 98.5 F (36.9 C) (Oral)   Resp 18   Ht 5' 9"$  (1.753 m)   Wt 91.2 kg   SpO2 92%   BMI 29.68 kg/m  Physical Exam  ED Results / Procedures / Treatments   Labs (all labs ordered are listed, but only abnormal results are  displayed) Labs Reviewed - No data to display  EKG None  Radiology No results found.  Procedures Procedures  {Document cardiac monitor, telemetry assessment procedure when appropriate:1}  Medications Ordered in ED Medications - No data to display  ED Course/ Medical Decision Making/ A&P   {   Click here for ABCD2, HEART and other calculatorsREFRESH Note before signing :1}                          Medical Decision Making Risk Prescription drug management.   ***  {Document critical care time when appropriate:1} {Document review of labs and clinical decision tools ie heart score, Chads2Vasc2 etc:1}  {Document your independent review of radiology images, and any outside records:1} {Document your discussion with family members, caretakers, and with consultants:1} {Document social determinants of health affecting pt's care:1} {Document your decision making why or why not admission, treatments were needed:1} Final Clinical Impression(s) / ED Diagnoses Final diagnoses:  Tenosynovitis, de Quervain  Left wrist pain    Rx / DC Orders ED Discharge Orders          Ordered    HYDROcodone-acetaminophen (NORCO/VICODIN) 5-325 MG tablet  Every 4 hours PRN        05/12/22 0806

## 2022-05-12 NOTE — Discharge Instructions (Signed)
You were seen for your wrist pain in the emergency department.  It is likely from a condition called De Quervain's tenosynovitis.  At home, please use the splint that was provided to you in Tylenol and Motrin for your pain. You may also take the norco we have prescribed you for any breakthrough pain that may have.  Do not take this before driving or operating heavy machinery.  Do not take this medication with alcohol.   Check your MyChart online for the results of any tests that had not resulted by the time you left the emergency department.   Follow-up with your primary doctor in 2-3 days regarding your visit.  Follow-up with the hand surgeon Dr. Greta Doom as soon as possible.  Return immediately to the emergency department if you experience any of the following: Weakness of your hand, or any other concerning symptoms.    Thank you for visiting our Emergency Department. It was a pleasure taking care of you today.

## 2022-11-19 ENCOUNTER — Telehealth (HOSPITAL_COMMUNITY): Payer: Self-pay | Admitting: Psychiatry

## 2022-11-19 NOTE — Telephone Encounter (Signed)
Called to confirm patient will be attending appointment on 08/23 with Dr Adrian Blackwater. There was no answer and I left a message.

## 2022-11-21 ENCOUNTER — Ambulatory Visit (HOSPITAL_COMMUNITY): Payer: BC Managed Care – PPO | Admitting: Psychiatry

## 2023-03-10 DIAGNOSIS — C50212 Malignant neoplasm of upper-inner quadrant of left female breast: Secondary | ICD-10-CM | POA: Diagnosis not present

## 2023-03-10 DIAGNOSIS — M25512 Pain in left shoulder: Secondary | ICD-10-CM | POA: Diagnosis not present

## 2023-03-10 DIAGNOSIS — Z171 Estrogen receptor negative status [ER-]: Secondary | ICD-10-CM | POA: Diagnosis not present

## 2023-03-10 DIAGNOSIS — M25612 Stiffness of left shoulder, not elsewhere classified: Secondary | ICD-10-CM | POA: Diagnosis not present

## 2023-03-10 DIAGNOSIS — M79602 Pain in left arm: Secondary | ICD-10-CM | POA: Diagnosis not present

## 2023-03-26 DIAGNOSIS — C50212 Malignant neoplasm of upper-inner quadrant of left female breast: Secondary | ICD-10-CM | POA: Diagnosis not present

## 2023-03-26 DIAGNOSIS — Z171 Estrogen receptor negative status [ER-]: Secondary | ICD-10-CM | POA: Diagnosis not present

## 2023-03-26 DIAGNOSIS — R531 Weakness: Secondary | ICD-10-CM | POA: Diagnosis not present

## 2023-03-27 DIAGNOSIS — K2970 Gastritis, unspecified, without bleeding: Secondary | ICD-10-CM | POA: Diagnosis not present

## 2023-03-27 DIAGNOSIS — Z171 Estrogen receptor negative status [ER-]: Secondary | ICD-10-CM | POA: Diagnosis not present

## 2023-03-27 DIAGNOSIS — C50212 Malignant neoplasm of upper-inner quadrant of left female breast: Secondary | ICD-10-CM | POA: Diagnosis not present

## 2023-03-27 DIAGNOSIS — Z1231 Encounter for screening mammogram for malignant neoplasm of breast: Secondary | ICD-10-CM | POA: Diagnosis not present

## 2023-03-30 DIAGNOSIS — Z5112 Encounter for antineoplastic immunotherapy: Secondary | ICD-10-CM | POA: Diagnosis not present

## 2023-03-30 DIAGNOSIS — Z95828 Presence of other vascular implants and grafts: Secondary | ICD-10-CM | POA: Diagnosis not present

## 2023-03-30 DIAGNOSIS — R112 Nausea with vomiting, unspecified: Secondary | ICD-10-CM | POA: Diagnosis not present

## 2023-03-30 DIAGNOSIS — Z9012 Acquired absence of left breast and nipple: Secondary | ICD-10-CM | POA: Diagnosis not present

## 2023-03-30 DIAGNOSIS — T451X5A Adverse effect of antineoplastic and immunosuppressive drugs, initial encounter: Secondary | ICD-10-CM | POA: Diagnosis not present

## 2023-03-30 DIAGNOSIS — C50212 Malignant neoplasm of upper-inner quadrant of left female breast: Secondary | ICD-10-CM | POA: Diagnosis not present

## 2023-03-30 DIAGNOSIS — Z171 Estrogen receptor negative status [ER-]: Secondary | ICD-10-CM | POA: Diagnosis not present

## 2023-03-30 DIAGNOSIS — K5900 Constipation, unspecified: Secondary | ICD-10-CM | POA: Diagnosis not present

## 2023-03-30 DIAGNOSIS — G620 Drug-induced polyneuropathy: Secondary | ICD-10-CM | POA: Diagnosis not present

## 2023-04-02 DIAGNOSIS — N650 Deformity of reconstructed breast: Secondary | ICD-10-CM | POA: Diagnosis not present

## 2023-04-08 DIAGNOSIS — Z0000 Encounter for general adult medical examination without abnormal findings: Secondary | ICD-10-CM | POA: Diagnosis not present

## 2023-04-08 DIAGNOSIS — N898 Other specified noninflammatory disorders of vagina: Secondary | ICD-10-CM | POA: Diagnosis not present

## 2023-04-21 DIAGNOSIS — Z0000 Encounter for general adult medical examination without abnormal findings: Secondary | ICD-10-CM | POA: Diagnosis not present

## 2023-04-21 DIAGNOSIS — G43911 Migraine, unspecified, intractable, with status migrainosus: Secondary | ICD-10-CM | POA: Diagnosis not present

## 2023-04-21 DIAGNOSIS — C50919 Malignant neoplasm of unspecified site of unspecified female breast: Secondary | ICD-10-CM | POA: Diagnosis not present

## 2023-04-21 DIAGNOSIS — J4531 Mild persistent asthma with (acute) exacerbation: Secondary | ICD-10-CM | POA: Diagnosis not present

## 2023-04-21 DIAGNOSIS — J4520 Mild intermittent asthma, uncomplicated: Secondary | ICD-10-CM | POA: Diagnosis not present

## 2023-10-26 ENCOUNTER — Other Ambulatory Visit (HOSPITAL_COMMUNITY): Payer: Self-pay | Admitting: Radiology

## 2023-10-26 DIAGNOSIS — J45909 Unspecified asthma, uncomplicated: Secondary | ICD-10-CM
